# Patient Record
Sex: Male | Born: 1960 | Race: Black or African American | Hispanic: No | Marital: Married
Health system: Midwestern US, Community
[De-identification: ages and names within clinical notes are randomized; demographics above are authoritative.]

## PROBLEM LIST (undated history)

## (undated) DIAGNOSIS — I639 Cerebral infarction, unspecified: Secondary | ICD-10-CM

## (undated) DIAGNOSIS — E119 Type 2 diabetes mellitus without complications: Secondary | ICD-10-CM

## (undated) DIAGNOSIS — N289 Disorder of kidney and ureter, unspecified: Secondary | ICD-10-CM

## (undated) DIAGNOSIS — I10 Essential (primary) hypertension: Secondary | ICD-10-CM

---

## 2012-02-23 ENCOUNTER — Emergency Department (HOSPITAL_BASED_OUTPATIENT_CLINIC_OR_DEPARTMENT_OTHER): Payer: Worker's Compensation

## 2012-02-23 ENCOUNTER — Emergency Department (HOSPITAL_BASED_OUTPATIENT_CLINIC_OR_DEPARTMENT_OTHER)
Admission: EM | Admit: 2012-02-23 | Discharge: 2012-02-23 | Disposition: A | Discharge status: Home / Self Care | Payer: Worker's Compensation | Attending: Emergency Medicine | Admitting: Emergency Medicine

## 2012-02-23 ENCOUNTER — Encounter (HOSPITAL_BASED_OUTPATIENT_CLINIC_OR_DEPARTMENT_OTHER): Payer: Self-pay | Admitting: *Deleted

## 2012-02-23 DIAGNOSIS — S46909A Unspecified injury of unspecified muscle, fascia and tendon at shoulder and upper arm level, unspecified arm, initial encounter: Secondary | ICD-10-CM | POA: Insufficient documentation

## 2012-02-23 DIAGNOSIS — S43499A Other sprain of unspecified shoulder joint, initial encounter: Secondary | ICD-10-CM | POA: Insufficient documentation

## 2012-02-23 DIAGNOSIS — W010XXA Fall on same level from slipping, tripping and stumbling without subsequent striking against object, initial encounter: Secondary | ICD-10-CM | POA: Insufficient documentation

## 2012-02-23 DIAGNOSIS — M7989 Other specified soft tissue disorders: Secondary | ICD-10-CM | POA: Insufficient documentation

## 2012-02-23 DIAGNOSIS — Y929 Unspecified place or not applicable: Secondary | ICD-10-CM | POA: Insufficient documentation

## 2012-02-23 DIAGNOSIS — Y999 Unspecified external cause status: Secondary | ICD-10-CM | POA: Insufficient documentation

## 2012-02-23 DIAGNOSIS — S4990XA Unspecified injury of shoulder and upper arm, unspecified arm, initial encounter: Secondary | ICD-10-CM

## 2012-02-23 DIAGNOSIS — S4980XA Other specified injuries of shoulder and upper arm, unspecified arm, initial encounter: Secondary | ICD-10-CM | POA: Insufficient documentation

## 2012-02-23 DIAGNOSIS — Y939 Activity, unspecified: Secondary | ICD-10-CM | POA: Insufficient documentation

## 2012-02-23 MED ORDER — METOPROLOL TARTRATE 1 MG/ML IV SOLN: 5.0000 mg | Freq: Once | INTRAVENOUS | Status: DC
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
## 2014-09-24 ENCOUNTER — Emergency Department (HOSPITAL_BASED_OUTPATIENT_CLINIC_OR_DEPARTMENT_OTHER): Payer: Medicaid Other

## 2014-09-24 ENCOUNTER — Encounter (HOSPITAL_BASED_OUTPATIENT_CLINIC_OR_DEPARTMENT_OTHER): Payer: Self-pay | Admitting: *Deleted

## 2014-09-24 ENCOUNTER — Emergency Department (HOSPITAL_BASED_OUTPATIENT_CLINIC_OR_DEPARTMENT_OTHER)
Admission: EM | Admit: 2014-09-24 | Discharge: 2014-09-24 | Disposition: A | Discharge status: Home / Self Care | Payer: Medicaid Other | Attending: Emergency Medicine | Admitting: Emergency Medicine

## 2014-09-24 DIAGNOSIS — Y998 Other external cause status: Secondary | ICD-10-CM | POA: Insufficient documentation

## 2014-09-24 DIAGNOSIS — M25421 Effusion, right elbow: Secondary | ICD-10-CM | POA: Insufficient documentation

## 2014-09-24 DIAGNOSIS — S299XXA Unspecified injury of thorax, initial encounter: Secondary | ICD-10-CM | POA: Insufficient documentation

## 2014-09-24 DIAGNOSIS — Y9289 Other specified places as the place of occurrence of the external cause: Secondary | ICD-10-CM | POA: Insufficient documentation

## 2014-09-24 DIAGNOSIS — Y9389 Activity, other specified: Secondary | ICD-10-CM | POA: Diagnosis not present

## 2014-09-24 DIAGNOSIS — S59901A Unspecified injury of right elbow, initial encounter: Secondary | ICD-10-CM | POA: Diagnosis present

## 2014-09-24 DIAGNOSIS — Z720 Tobacco use: Secondary | ICD-10-CM | POA: Diagnosis not present

## 2014-09-24 DIAGNOSIS — M25521 Pain in right elbow: Secondary | ICD-10-CM

## 2014-09-24 DIAGNOSIS — S60511A Abrasion of right hand, initial encounter: Secondary | ICD-10-CM | POA: Diagnosis not present

## 2014-09-24 DIAGNOSIS — R0789 Other chest pain: Secondary | ICD-10-CM

## 2014-09-24 MED ORDER — OXYCODONE-ACETAMINOPHEN 5-325 MG PO TABS
2.0000 | ORAL_TABLET | Freq: Once | ORAL | Status: AC
  Administered 2014-09-24: 2 via ORAL
  Filled 2014-09-24: qty 2

## 2014-09-24 MED ORDER — IBUPROFEN 600 MG PO TABS: 600.0000 mg | ORAL_TABLET | Freq: Three times a day (TID) | ORAL | Status: DC | PRN

## 2014-09-24 MED ORDER — KETOROLAC TROMETHAMINE 60 MG/2ML IM SOLN
60.0000 mg | Freq: Once | INTRAMUSCULAR | Status: AC
  Administered 2014-09-24: 60 mg via INTRAMUSCULAR
  Filled 2014-09-24: qty 2

## 2014-09-24 MED ORDER — HYDROCODONE-ACETAMINOPHEN 5-325 MG PO TABS: 1.0000 | ORAL_TABLET | ORAL | Status: DC | PRN

## 2014-09-24 NOTE — ED Notes (Signed)
EMT at bedside to place splint

## 2014-09-24 NOTE — ED Notes (Signed)
Back to xray for 2nd elbow image

## 2014-09-24 NOTE — ED Notes (Signed)
Pt's wife was able to pick him up. D/c instructions reviewed with her as well per pt's request.
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
## 2014-09-24 NOTE — ED Notes (Signed)
Returned from xray

## 2014-09-24 NOTE — ED Notes (Signed)
Transported to xray 

## 2014-09-24 NOTE — ED Notes (Signed)
Pt now unable to get a ride home. Will stay here in the ED until 6am when he is able to drive
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
## 2014-09-24 NOTE — ED Notes (Signed)
MD with pt  
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
## 2014-09-24 NOTE — Discharge Instructions (Signed)
Elbow Fracture, Simple A fracture is a break in one of the bones.When fractures are not displaced or separated, they may be treated with only a sling or splint. The sling or splint may only be required for two to three weeks. In these cases, often the elbow is put through early range of motion exercises to prevent the elbow from getting stiff. DIAGNOSIS  The diagnosis (learning what is wrong) of a fractured elbow is made by x-ray. These may be required before and after the elbow is put into a splint or cast. X-rays are taken after to make sure the bone pieces have not moved. HOME CARE INSTRUCTIONS   Only take over-the-counter or prescription medicines for pain, discomfort, or fever as directed by your caregiver.  If you have a splint held on with an elastic wrap and your hand or fingers become numb or cold and blue, loosen the wrap and reapply more loosely. See your caregiver if there is no relief.  You may use ice for twenty minutes, four times per day, for the first two to three days.  Use your elbow as directed.  See your caregiver as directed. It is very important to keep all follow-up referrals and appointments in order to avoid any long-term problems with your elbow including chronic pain or stiffness. SEEK IMMEDIATE MEDICAL CARE IF:   There is swelling or increasing pain in elbow.  You begin to lose feeling or experience numbness or tingling in your hand or fingers.  You develop swelling of the hand and fingers.  You get a cold or blue hand or fingers on affected side. MAKE SURE YOU:   Understand these instructions.  Will watch your condition.  Will get help right away if you are not doing well or get worse. Document Released: 02/25/2001 Document Revised: 05/26/2011 Document Reviewed: 01/16/2009 Exit[employer] Patient Information 2015 [employer]. This information is not intended to replace advice given to you by your health care provider. Make sure you discuss any questions you  have with your health care provider.  

## 2016-07-19 ENCOUNTER — Inpatient Hospital Stay
Admit: 2016-07-19 | Discharge: 2016-07-20 | Discharge status: Against Medical Advice | Payer: Self-pay | Attending: Emergency Medicine

## 2016-07-19 ENCOUNTER — Emergency Department: Admit: 2016-07-19 | Payer: Self-pay | Primary: Family Medicine

## 2016-07-19 DIAGNOSIS — I161 Hypertensive emergency: Secondary | ICD-10-CM

## 2016-07-19 LAB — METABOLIC PANEL, BASIC
BUN: 22 mg/dl (ref 7–25)
CO2: 27 mEq/L (ref 21–32)
Calcium: 8.6 mg/dl (ref 8.5–10.1)
Chloride: 103 mEq/L (ref 98–107)
Creatinine: 2.5 mg/dl — ABNORMAL HIGH (ref 0.6–1.3)
GFR est AA: 34
GFR est non-AA: 28
Glucose: 243 mg/dl — ABNORMAL HIGH (ref 74–106)
Potassium: 3.7 mEq/L (ref 3.5–5.1)
Sodium: 138 mEq/L (ref 136–145)

## 2016-07-19 LAB — CBC WITH AUTOMATED DIFF
BASOPHILS: 0.3 % (ref 0–3)
EOSINOPHILS: 3.4 % (ref 0–5)
HCT: 35.1 % — ABNORMAL LOW (ref 37.0–50.0)
HGB: 11.2 gm/dl — ABNORMAL LOW (ref 12.4–17.2)
IMMATURE GRANULOCYTES: 0.3 % (ref 0.0–3.0)
LYMPHOCYTES: 30.5 % (ref 28–48)
MCH: 23.7 pg (ref 23.0–34.6)
MCHC: 31.9 gm/dl (ref 30.0–36.0)
MCV: 74.2 fL — ABNORMAL LOW (ref 80.0–98.0)
MONOCYTES: 5 % (ref 1–13)
MPV: 9.5 fL (ref 6.0–10.0)
NEUTROPHILS: 60.5 % (ref 34–64)
NRBC: 0 (ref 0–0)
PLATELET: 233 10*3/uL (ref 140–450)
RBC: 4.73 M/uL (ref 3.80–5.70)
RDW-SD: 38.2 (ref 35.1–43.9)
WBC: 7.8 10*3/uL (ref 4.0–11.0)

## 2016-07-19 LAB — MAGNESIUM: Magnesium: 1.8 mg/dl (ref 1.6–2.6)

## 2016-07-19 LAB — POC TROPONIN: Troponin-I: 0.05 ng/ml (ref 0.00–0.07)

## 2016-07-19 MED ORDER — SODIUM CHLORIDE 0.9 % IJ SYRG
Freq: Once | INTRAMUSCULAR | Status: DC
Start: 2016-07-19 — End: 2016-07-20
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
## 2016-07-19 NOTE — ED Provider Notes (Addendum)
[facility]  Emergency Department Treatment Report        Patient: Kevin MolderDwayne Calhoun Age: 56 y.o. Sex: male    Date of Birth: 11/03/1960 Admit Date: 07/19/2016 PCP: Sol BlazingPhys Other, MD   MRN: [id number]  CSN: [id number]     Room: OTF/OTF Time Dictated: 6:29 PM      Chief Complaint   Chief Complaint   Patient presents with   ??? Chest Pain       History of Present Illness   56 y.o. male with history of hypertension, hyperlipidemia, insulin-dependent diabetes, smoking presenting to the emergency department with the complaint of 2-3 minutes of substernal chest tightness.  The pain did not radiate.  The patient started expressing pain after going outside smoking a cigarette.  The patient did not want to come to the emergency department, but his wife and other family members urged him to call an ambulance and come here for further evaluation.  Recent is pain-free at this time.  He states he is pain-free at the time of EMS evaluation.  He was given one nitroglycerin and aspirin on the scene.  His blood pressure on the scene was 210 systolic.  He has no history of myocardial infarction.  He denies any family history of myocardial infarction.  He denies any history of DVT or PE.  He denies any leg swelling.  He did have a stroke in July of this year and has residual right-sided weakness that is unchanged.  Denies any fever, any chills, any productive cough, any shortness of breath.  He says that he never has pain or shortness of breath or issues with exertion    Review of Systems   Review of Systems   Constitutional: Negative for chills and fever.   HENT: Negative for congestion.    Eyes: Negative for blurred vision.   Respiratory: Negative for cough, sputum production and shortness of breath.    Cardiovascular: Positive for chest pain. Negative for palpitations and leg swelling.   Gastrointestinal: Negative for abdominal pain, diarrhea, nausea and vomiting.    Genitourinary: Negative for dysuria, frequency and urgency.   Musculoskeletal: Negative for myalgias.   Skin: Negative for rash.   Neurological: Positive for focal weakness (History of stroke with right-sided weakness). Negative for speech change and headaches.       Past Medical/Surgical History     Past Medical History:   Diagnosis Date   ??? CVA (cerebral vascular accident) (HCC)     right side deficit     History reviewed. No pertinent surgical history.    Social History     Social History     Social History   ??? Marital status: MARRIED     Spouse name: N/A   ??? Number of children: N/A   ??? Years of education: N/A     Occupational History   ??? Not on file.     Social History Main Topics   ??? Smoking status: Current Some Day Smoker   ??? Smokeless tobacco: Never Used   ??? Alcohol use No   ??? Drug use: No   ??? Sexual activity: Not on file     Other Topics Concern   ??? Not on file     Social History Narrative   ??? No narrative on file       Family History   History reviewed. No pertinent family history.    Current Medications     None       Allergies  No Known Allergies    Physical Exam   ED Triage Vitals   ED Encounter Vitals Group      BP 07/19/16 1827 157/91      Pulse (Heart Rate) 07/19/16 1827 85      Resp Rate 07/19/16 1827 16      Temp 07/19/16 1827 97.5 ??F (36.4 ??C)      Temp src --       O2 Sat (%) 07/19/16 1827 98 %      Weight 07/19/16 1827 200 lb      Height 07/19/16 1827 5\' 11"      Physical Exam   Constitutional: He is oriented to person, place, and time and well-developed, well-nourished, and in no distress. No distress.   HENT:   Head: Normocephalic and atraumatic.   Eyes: No scleral icterus.   Neck: No JVD present.   Cardiovascular: Normal rate, regular rhythm, normal heart sounds and intact distal pulses.  Exam reveals no gallop and no friction rub.    No murmur heard.  2+ equal radial pulses bilaterally   Pulmonary/Chest: Effort normal and breath sounds normal. No respiratory distress. He has no wheezes.    Abdominal: Soft. Bowel sounds are normal. He exhibits no distension. There is no tenderness.   Musculoskeletal: He exhibits no edema.   Neurological: He is alert and oriented to person, place, and time. GCS score is 15.   Skin: Skin is warm and dry. He is not diaphoretic.   Nursing note and vitals reviewed.       Impression and Management Plan/MDM/ED Course   56 year old male presenting to the emergency department with chest tightness that is now resolved.  He has history of hypertension hyperlipidemia, diabetes, smoking.  He has not had a myocardial infarction before.  His prehospital EKG was sinus rhythm at a rate of 81.  His symptoms nonspecific ST changes, specifically there is a somewhat planar ST segments in V2 but in the setting of an RSR prime J point has a high takeoff but there is 1 mm of elevation there.  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  Otherwise EKG is consistent with a prehospital EKG.  I do not think this is a pulmonary embolism, patient has no unilateral leg swelling, has no pleuritic chest pain is chest pain-free right now.    I reviewed his lab values, he has a creatinine that is elevated at 2.48.  I don't know what his baseline is.  His troponin is detectable at 0.05.  He has uncontrolled type 2 diabetes with a glucose of 243.  His bicarbonate is normal.  He does not have an anion gap.  I do not think he is diabetic ketoacidosis  His chest x-ray was negative for any infiltrate or effusion.  I don't think that this is a presentation of pneumonia.  He has no history of DVT or pulmonary embolism.  I don't think this is a pulmonary embolism.  He has no lower leg swelling.  He has significant cardiac risk factors and slight EKG changes consistent with underlying cardiovascular disease.  I would like to admit him to the hospital for  further cardiology evaluation.  Especially, with his severe range hypertension and elevated creatinine as well as detectable troponin, which would be consistent with hypertensive emergency.  The patient however, emphatically does not want to be admitted to the hospital.  He  is competent to make his own medical decisions and is alert and oriented ??3.  His wife is at the bedside as well and agrees with his decision making.  Patient was told that he would be risking permanent disability or death should he leave the hospital AGAINST MEDICAL ADVICE.  We offered transfer to another facility, since there concern was that they would have to go back to [place] where they live. They declined to do this.  They did say that they would follow-up with the physician at their home in [place].  I gave strict return precautions and offered them the opportunity to return to the emergency department any time should they change her mind regarding leaving AGAINST MEDICAL ADVICE          Diagnostic Studies   Lab:   Recent Results (from the past 12 hour(s))   CBC WITH AUTOMATED DIFF    Collection Time: 07/19/16  6:48 PM   Result Value Ref Range    WBC 7.8 4.0 - 11.0 1000/mm3    RBC 4.73 3.80 - 5.70 M/uL    HGB 11.2 (L) 12.4 - 17.2 gm/dl    HCT 78.4 (L) 69.6 - 50.0 %    MCV 74.2 (L) 80.0 - 98.0 fL    MCH 23.7 23.0 - 34.6 pg    MCHC 31.9 30.0 - 36.0 gm/dl    PLATELET 295 284 - 132 1000/mm3    MPV 9.5 6.0 - 10.0 fL    RDW-SD 38.2 35.1 - 43.9      NRBC 0 0 - 0      IMMATURE GRANULOCYTES 0.3 0.0 - 3.0 %    NEUTROPHILS 60.5 34 - 64 %    LYMPHOCYTES 30.5 28 - 48 %    MONOCYTES 5.0 1 - 13 %    EOSINOPHILS 3.4 0 - 5 %    BASOPHILS 0.3 0 - 3 %   METABOLIC PANEL, BASIC    Collection Time: 07/19/16  6:48 PM   Result Value Ref Range    Sodium 138 136 - 145 mEq/L    Potassium 3.7 3.5 - 5.1 mEq/L    Chloride 103 98 - 107 mEq/L    CO2 27 21 - 32 mEq/L    Glucose 243 (H) 74 - 106 mg/dl    BUN 22 7 - 25 mg/dl     Creatinine 2.5 (H) 0.6 - 1.3 mg/dl    GFR est AA 44.0      GFR est non-AA 28      Calcium 8.6 8.5 - 10.1 mg/dl   MAGNESIUM    Collection Time: 07/19/16  6:48 PM   Result Value Ref Range    Magnesium 1.8 1.6 - 2.6 mg/dl   POC TROPONIN-I    Collection Time: 07/19/16  6:53 PM   Result Value Ref Range    Troponin-I 0.05 0.00 - 0.07 ng/ml     Labs Reviewed   CBC WITH AUTOMATED DIFF - Abnormal; Notable for the following:        Result Value    HGB 11.2 (*)     HCT 35.1 (*)     MCV 74.2 (*)     All other components within normal limits   METABOLIC PANEL, BASIC - Abnormal; Notable for the following:     Glucose 243 (*)     Creatinine 2.5 (*)     All other components within normal limits   MAGNESIUM   POC TROPONIN-I  Imaging:    Xr Chest Pa Lat    Result Date: 07/19/2016  INDICATION: chest pain   EXAMINATION: XR CHEST PA LAT COMPARISON: None FINDINGS: The study shows a normal sized heart.. The lungs are clear and well expanded.       IMPRESSION: Normal chest       Final Diagnosis       ICD-10-CM ICD-9-CM   1. Hypertensive emergency I16.1 401.9   2. Acute kidney injury (HCC) N17.9 584.9   3. Chest pain, unspecified type R07.9 786.50   4. Type 2 diabetes mellitus with hyperglycemia, with long-term current use of insulin (HCC) E11.65 250.00    Z79.4 790.29     V58.67       Disposition   Discharge from the hospital against medical advice      The patient was personally evaluated by myself and Dr. Thana Ates who agrees with the above assessment and plan.    Knox Royalty, MD  [facility]  Jul 20, 2016    My signature above authenticates this document and my orders, the final ??  diagnosis (es), discharge prescription (s), and instructions in the Epic ??  record.  If you have any questions please contact [phone].  ??  Nursing notes have been reviewed by the physician/ advanced practice ??  Clinician.    Dragon medical dictation software was used for portions of this report. Unintended voice recognition errors may occur.     Continued by Dr. Dyke Brackett dictation software was used for portions of this report. Unintended errors in transcription may occur.    I interviewed and examined the patient. Discussed with the resident and agree with their evaluation and plan as documented here.    Patient is evaluated for the complaint of chest pain.  Had a brief episode of chest pain that is currently resolved.  He is originally from [place] and he is here with his family.  On exam he denies any pain, his heart is regular his lungs are clear his abdomen is nontender.  He has history of stroke but doesn't really seem to have any drift in the right arm or right leg, left arm left leg normal strength.  Both arms warm and well-perfused with good radial pulses.  ??  PE dissection considered but thought to be less likely pain is resolved  He is hypertensive.  He was evaluated with chest x-ray EKG and labs.  Recommendation made to patient and family for admission and further evaluation.  Even from the time right after he got here the patient was adamant that he wanted to be discharged.  He is a and O ??3.  He understands our concerns and he states he does not want to stay and would like to be discharged to go home.  His wife indicates that she would also like him to be treated as possible closer to their home in [place].  She states she intends to drive him home and she thinks he'll he'll be okay.  She indicated 1 difficulty with staying here is that she has to go home to [place] and could not drive back and forth to see the patient.  I offered to initiate a hospital to hospital transfer by ambulance if they're concerned with the location and both patient and wife declined concerned about the cost.  Patient is aware that he risk permanent disability or death by leaving AGAINST MEDICAL ADVICE, his wife understands that as well,  he is advised to seek follow-up treatment as  soon as possible and certainly return to the ER if symptoms recur or he has new or worse symptoms.

## 2016-07-19 NOTE — ED Notes (Signed)
9:51 PM  07/19/16     Discharge instructions given to pt (name) with verbalization of understanding. Patient accompanied by family.  Patient discharged with the following prescriptions none. Patient discharged to home (destination).      Kevin GabElise Marie Laurence Joulaud, RN

## 2016-07-20 LAB — EKG 12-LEAD
Atrial Rate: 84 {beats}/min
Diagnosis: NORMAL
P Axis: 44 degrees
P-R Interval: 164 ms
Q-T Interval: 402 ms
QRS Duration: 142 ms
QTc Calculation (Bazett): 475 ms
R Axis: -59 degrees
T Axis: 37 degrees
Ventricular Rate: 84 {beats}/min

## 2016-07-20 LAB — EKG, 12 LEAD, INITIAL
Atrial Rate: 84 {beats}/min
Calculated P Axis: 44 degrees
Calculated R Axis: -59 degrees
Calculated T Axis: 37 degrees
Diagnosis: NORMAL
P-R Interval: 164 ms
Q-T Interval: 402 ms
QRS Duration: 142 ms
QTC Calculation (Bezet): 475 ms
Ventricular Rate: 84 {beats}/min

## 2016-07-21 ENCOUNTER — Observation Stay (HOSPITAL_BASED_OUTPATIENT_CLINIC_OR_DEPARTMENT_OTHER): Payer: Self-pay

## 2016-07-21 ENCOUNTER — Encounter (HOSPITAL_BASED_OUTPATIENT_CLINIC_OR_DEPARTMENT_OTHER): Payer: Self-pay | Admitting: Emergency Medicine

## 2016-07-21 ENCOUNTER — Observation Stay (HOSPITAL_BASED_OUTPATIENT_CLINIC_OR_DEPARTMENT_OTHER)
Admission: EM | Admit: 2016-07-21 | Discharge: 2016-07-23 | Disposition: A | Discharge status: Home / Self Care | Payer: Self-pay | Attending: Internal Medicine | Admitting: Internal Medicine

## 2016-07-21 DIAGNOSIS — F17210 Nicotine dependence, cigarettes, uncomplicated: Secondary | ICD-10-CM | POA: Insufficient documentation

## 2016-07-21 DIAGNOSIS — R269 Unspecified abnormalities of gait and mobility: Secondary | ICD-10-CM

## 2016-07-21 DIAGNOSIS — R7989 Other specified abnormal findings of blood chemistry: Secondary | ICD-10-CM | POA: Diagnosis present

## 2016-07-21 DIAGNOSIS — I69320 Aphasia following cerebral infarction: Secondary | ICD-10-CM | POA: Insufficient documentation

## 2016-07-21 DIAGNOSIS — E781 Pure hyperglyceridemia: Secondary | ICD-10-CM | POA: Insufficient documentation

## 2016-07-21 DIAGNOSIS — I69331 Monoplegia of upper limb following cerebral infarction affecting right dominant side: Secondary | ICD-10-CM | POA: Insufficient documentation

## 2016-07-21 DIAGNOSIS — I160 Hypertensive urgency: Secondary | ICD-10-CM | POA: Diagnosis present

## 2016-07-21 DIAGNOSIS — E1122 Type 2 diabetes mellitus with diabetic chronic kidney disease: Secondary | ICD-10-CM | POA: Insufficient documentation

## 2016-07-21 DIAGNOSIS — E1165 Type 2 diabetes mellitus with hyperglycemia: Secondary | ICD-10-CM | POA: Diagnosis present

## 2016-07-21 DIAGNOSIS — Z8673 Personal history of transient ischemic attack (TIA), and cerebral infarction without residual deficits: Secondary | ICD-10-CM

## 2016-07-21 DIAGNOSIS — G44209 Tension-type headache, unspecified, not intractable: Secondary | ICD-10-CM

## 2016-07-21 DIAGNOSIS — I129 Hypertensive chronic kidney disease with stage 1 through stage 4 chronic kidney disease, or unspecified chronic kidney disease: Secondary | ICD-10-CM | POA: Insufficient documentation

## 2016-07-21 DIAGNOSIS — E118 Type 2 diabetes mellitus with unspecified complications: Secondary | ICD-10-CM

## 2016-07-21 DIAGNOSIS — Z79899 Other long term (current) drug therapy: Secondary | ICD-10-CM | POA: Insufficient documentation

## 2016-07-21 DIAGNOSIS — I4510 Unspecified right bundle-branch block: Secondary | ICD-10-CM | POA: Insufficient documentation

## 2016-07-21 DIAGNOSIS — E785 Hyperlipidemia, unspecified: Secondary | ICD-10-CM | POA: Insufficient documentation

## 2016-07-21 DIAGNOSIS — Z794 Long term (current) use of insulin: Secondary | ICD-10-CM | POA: Insufficient documentation

## 2016-07-21 DIAGNOSIS — IMO0002 Reserved for concepts with insufficient information to code with codable children: Secondary | ICD-10-CM

## 2016-07-21 DIAGNOSIS — N179 Acute kidney failure, unspecified: Secondary | ICD-10-CM | POA: Diagnosis present

## 2016-07-21 DIAGNOSIS — R079 Chest pain, unspecified: Principal | ICD-10-CM

## 2016-07-21 DIAGNOSIS — D509 Iron deficiency anemia, unspecified: Secondary | ICD-10-CM | POA: Insufficient documentation

## 2016-07-21 DIAGNOSIS — I452 Bifascicular block: Secondary | ICD-10-CM | POA: Insufficient documentation

## 2016-07-21 DIAGNOSIS — R072 Precordial pain: Secondary | ICD-10-CM

## 2016-07-21 DIAGNOSIS — Z7982 Long term (current) use of aspirin: Secondary | ICD-10-CM | POA: Insufficient documentation

## 2016-07-21 DIAGNOSIS — N183 Chronic kidney disease, stage 3 (moderate): Secondary | ICD-10-CM | POA: Insufficient documentation

## 2016-07-21 DIAGNOSIS — R778 Other specified abnormalities of plasma proteins: Secondary | ICD-10-CM | POA: Diagnosis present

## 2016-07-21 HISTORY — DX: Type 2 diabetes mellitus without complications: E11.9

## 2016-07-21 HISTORY — DX: Essential (primary) hypertension: I10

## 2016-07-21 HISTORY — DX: Disorder of kidney and ureter, unspecified: N28.9

## 2016-07-21 HISTORY — DX: Cerebral infarction, unspecified: I63.9

## 2016-07-21 LAB — URINALYSIS, ROUTINE W REFLEX MICROSCOPIC
BILIRUBIN URINE: NEGATIVE
Glucose, UA: 100 mg/dL — AB
KETONES UR: NEGATIVE mg/dL
Leukocytes, UA: NEGATIVE
NITRITE: NEGATIVE
PH: 6.5 (ref 5.0–8.0)
Protein, ur: >300 mg/dL — AB
Specific Gravity, Urine: 1.014 (ref 1.005–1.030)

## 2016-07-21 LAB — BASIC METABOLIC PANEL
Anion gap: 6 (ref 5–15)
BUN: 21 mg/dL — AB (ref 6–20)
CALCIUM: 8.8 mg/dL — AB (ref 8.9–10.3)
CO2: 27 mmol/L (ref 22–32)
Chloride: 105 mmol/L (ref 101–111)
Creatinine, Ser: 2.18 mg/dL — ABNORMAL HIGH (ref 0.61–1.24)
GFR calc Af Amer: 37 mL/min — ABNORMAL LOW (ref >60)
GFR, EST NON AFRICAN AMERICAN: 32 mL/min — AB (ref >60)
GLUCOSE: 162 mg/dL — AB (ref 65–99)
Potassium: 3.8 mmol/L (ref 3.5–5.1)
Sodium: 138 mmol/L (ref 135–145)

## 2016-07-21 LAB — CBC WITH DIFFERENTIAL/PLATELET
BASOS ABS: 0 10*3/uL (ref 0.0–0.1)
BASOS PCT: 0 %
EOS ABS: 0.3 10*3/uL (ref 0.0–0.7)
EOS PCT: 4 %
HCT: 35 % — ABNORMAL LOW (ref 39.0–52.0)
Hemoglobin: 11.7 g/dL — ABNORMAL LOW (ref 13.0–17.0)
Lymphocytes Relative: 34 %
Lymphs Abs: 2.4 10*3/uL (ref 0.7–4.0)
MCH: 24.3 pg — ABNORMAL LOW (ref 26.0–34.0)
MCHC: 33.4 g/dL (ref 30.0–36.0)
MCV: 72.8 fL — AB (ref 78.0–100.0)
MONO ABS: 0.6 10*3/uL (ref 0.1–1.0)
Monocytes Relative: 9 %
Neutro Abs: 3.9 10*3/uL (ref 1.7–7.7)
Neutrophils Relative %: 54 %
PLATELETS: 240 10*3/uL (ref 150–400)
RBC: 4.81 MIL/uL (ref 4.22–5.81)
RDW: 14.6 % (ref 11.5–15.5)
WBC: 7.2 10*3/uL (ref 4.0–10.5)

## 2016-07-21 LAB — URINALYSIS, MICROSCOPIC (REFLEX)

## 2016-07-21 MED ORDER — METOPROLOL TARTRATE 5 MG/5ML IV SOLN
10.0000 mg | Freq: Once | INTRAVENOUS | Status: AC
  Administered 2016-07-21: 10 mg via INTRAVENOUS
  Filled 2016-07-21: qty 10

## 2016-07-21 MED ORDER — LISINOPRIL 10 MG PO TABS
10.0000 mg | ORAL_TABLET | Freq: Every day | ORAL | Status: DC
  Administered 2016-07-21: 10 mg via ORAL
  Filled 2016-07-21: qty 1

## 2016-07-21 MED ORDER — ASPIRIN 81 MG PO CHEW
324.0000 mg | CHEWABLE_TABLET | Freq: Once | ORAL | Status: AC
  Administered 2016-07-21: 324 mg via ORAL
  Filled 2016-07-21: qty 4

## 2016-07-21 MED ORDER — HYDRALAZINE HCL 20 MG/ML IJ SOLN
10.0000 mg | Freq: Once | INTRAMUSCULAR | Status: AC
  Administered 2016-07-21: 10 mg via INTRAVENOUS
  Filled 2016-07-21: qty 1

## 2016-07-21 NOTE — ED Notes (Signed)
Paged hospitalist and cardiologist via carelink @ 9:39pm

## 2016-07-21 NOTE — ED Triage Notes (Addendum)
Patient [place] that [place] were out of town. Patient started to have chest pain. Patient also reports a Headache  - patient has a complete work up and it was suggested that he be admitted because of elevated kidney function and increased troponin of 0.05, he signed out AMA. Patient is currently denying Chest pain

## 2016-07-21 NOTE — ED Provider Notes (Signed)
Emergency Department Provider Note   I have reviewed the triage vital signs and the nursing notes.  By signing my name below, I, Modena Jansky, attest that this documentation has been prepared under the direction and in the presence of Dawanna Grauberger, Arlyss Repress, MD. Electronically Signed: Modena Jansky, Scribe. 07/21/2016. 8:07 PM.  HISTORY  Chief Complaint Headache  HPI Comments: Philip Martin is a 56 y.o. male who presents to the Emergency Department complaining of intermittent moderate chest pain that started 2 days ago. He reports having chest pain while he was traveling. He was evaluated at [facility] but left before he received treatment. He came to the ED as a follow-up to prior visit. He describes the pain as tight sensation lasting for about 1-2 minutes. He reports associated heart palpitations ("fluttering") and headache (gradually worsening). He admits to a hx of DM, HTN, renal disorder, and stroke. He denies any other complaints at this time.    Past Medical History:  Diagnosis Date  . Diabetes mellitus without complication (HCC)   . Hypertension   . Renal disorder   . Stroke [facility])     Patient Active Problem List   Diagnosis Date Noted  . Chest pain 07/21/2016    History reviewed. No pertinent surgical history.    Allergies Patient has no known allergies.  History reviewed. No pertinent family history.  Social History Social History  Substance Use Topics  . Smoking status: Current Every Day Smoker  . Smokeless tobacco: Never Used  . Alcohol use No    Review of Systems Constitutional: No fever/chills Eyes: No visual changes. ENT: No sore throat. Cardiovascular: +chest pain, +heart palpitations Respiratory: Denies shortness of breath. Gastrointestinal: No abdominal pain.  No nausea, no vomiting.  No diarrhea.  No constipation. Genitourinary: Negative for dysuria. Musculoskeletal: Negative for back pain. Skin: Negative for rash. Neurological:  +headaches, denies focal weakness or numbness.  10-point ROS otherwise negative.  ____________________________________________   PHYSICAL EXAM:  VITAL SIGNS: ED Triage Vitals  Enc Vitals Group     BP 07/21/16 1930 (!) 185/118     Pulse Rate 07/21/16 1930 81     Resp 07/21/16 1930 20     Temp 07/21/16 1930 98.2 F (36.8 C)     Temp Source 07/21/16 1930 Oral     SpO2 07/21/16 1930 100 %     Weight 07/21/16 1930 200 lb (90.7 kg)     Height 07/21/16 1930 5\' 11"  (1.803 m)     Pain Score 07/21/16 1927 7   Constitutional: Alert and oriented. Well appearing and in no acute distress. Eyes: Conjunctivae are normal. Head: Atraumatic. Nose: No congestion/rhinnorhea. Mouth/Throat: Mucous membranes are moist.  Oropharynx non-erythematous. Neck: No stridor.   Cardiovascular: Normal rate, regular rhythm. Good peripheral circulation. Grossly normal heart sounds.   Respiratory: Normal respiratory effort.  No retractions. Lungs CTAB. Gastrointestinal: Soft and nontender. No distention.  Musculoskeletal: No lower extremity tenderness nor edema. No gross deformities of extremities. Neurologic:  Normal language. Positive slurred speech. RUE weakness 3/5 (baseline) with associated sensory deficit and right face droop.  Skin:  Skin is warm, dry and intact. No rash noted. Psychiatric: Mood and affect are normal. Speech and behavior are normal.  ____________________________________________   LABS (all labs ordered are listed, but only abnormal results are displayed)  Labs Reviewed  BASIC METABOLIC PANEL - Abnormal; Notable for the following:       Result Value   Glucose, Bld 162 (*)    BUN 21 (*)  Creatinine, Ser 2.18 (*)    Calcium 8.8 (*)    GFR calc non Af Amer 32 (*)    GFR calc Af Amer 37 (*)    All other components within normal limits  CBC WITH DIFFERENTIAL/PLATELET - Abnormal; Notable for the following:    Hemoglobin 11.7 (*)    HCT 35.0 (*)    MCV 72.8 (*)    MCH 24.3 (*)     All other components within normal limits  TROPONIN I - Abnormal; Notable for the following:    Troponin I 0.05 (*)    All other components within normal limits  URINALYSIS, ROUTINE W REFLEX MICROSCOPIC - Abnormal; Notable for the following:    Glucose, UA 100 (*)    Hgb urine dipstick TRACE (*)    Protein, ur >300 (*)    All other components within normal limits  URINALYSIS, MICROSCOPIC (REFLEX) - Abnormal; Notable for the following:    Bacteria, UA RARE (*)    Squamous Epithelial / LPF 0-5 (*)    All other components within normal limits  TROPONIN I - Abnormal; Notable for the following:    Troponin I 0.05 (*)    All other components within normal limits  TROPONIN I - Abnormal; Notable for the following:    Troponin I 0.04 (*)    All other components within normal limits  TROPONIN I - Abnormal; Notable for the following:    Troponin I 0.04 (*)    All other components within normal limits  LIPID PANEL - Abnormal; Notable for the following:    Cholesterol 279 (*)    Triglycerides 537 (*)    HDL 31 (*)    All other components within normal limits  IRON AND TIBC - Abnormal; Notable for the following:    Iron 44 (*)    TIBC 248 (*)    All other components within normal limits  CBC WITH DIFFERENTIAL/PLATELET - Abnormal; Notable for the following:    Hemoglobin 11.3 (*)    HCT 35.8 (*)    MCV 72.5 (*)    MCH 22.9 (*)    All other components within normal limits  BASIC METABOLIC PANEL - Abnormal; Notable for the following:    Glucose, Bld 170 (*)    BUN 21 (*)    Creatinine, Ser 1.86 (*)    Calcium 8.7 (*)    GFR calc non Af Amer 39 (*)    GFR calc Af Amer 45 (*)    All other components within normal limits  GLUCOSE, CAPILLARY - Abnormal; Notable for the following:    Glucose-Capillary 183 (*)    All other components within normal limits  MRSA PCR SCREENING  RAPID URINE DRUG SCREEN, HOSP PERFORMED  TSH  HIV ANTIBODY (ROUTINE TESTING)  HEMOGLOBIN A1C    ____________________________________________  EKG   EKG Interpretation  Date/Time:  Monday Jul 21 2016 19:40:09 EDT Ventricular Rate:  79 PR Interval:    QRS Duration: 139 QT Interval:  398 QTC Calculation: 457 R Axis:   -53 Text Interpretation:  Sinus rhythm RBBB and LAFB No STEMI. No old tracing for comparison.  Confirmed by Kymberlyn Eckford MD, Yaziel Brandon [phone] on 07/21/2016 7:44:02 PM Also confirmed by Tangee Marszalek MD, Adaja Wander [phone]), editor Misty StanleyScales-Price, Shannon [phone])  on 07/22/2016 7:30:05 AM       ____________________________________________  RADIOLOGY  Dg Chest 2 View  Result Date: 07/21/2016 CLINICAL DATA:  56 y/o  M; palpitations with headache. EXAM: CHEST  2 VIEW COMPARISON:  09/24/2014  chest radiograph. FINDINGS: Stable heart size and mediastinal contours are within normal limits. Both lungs are clear. The visualized skeletal structures are unremarkable. IMPRESSION: No active cardiopulmonary disease. Electronically Signed   By: Mitzi Hansen M.D.   On: 07/21/2016 23:39    ____________________________________________   PROCEDURES  Procedure(s) performed:   Procedures  None ____________________________________________   INITIAL IMPRESSION / ASSESSMENT AND PLAN / ED COURSE  Pertinent labs & imaging results that were available during my care of the patient were reviewed by me and considered in my medical decision making (see chart for details).  Patient presents to the ED for evaluation of intermittent chest pressure for the last 3 days. Worst pain was 2 days prior but has had intermittent symptoms since. No acute distress or active pain. BP significantly elevated but patient not taking any home medications. EKG with no acute ischemia but no old for comparison.   10:00 PM Spoke with cardiology regarding the patient. Given his chronic kidney disease and significantly elevated blood pressure have lower suspicion for ACS with elevated troponin.   Discussed patient's case  with hospitalist, Dr. Toniann Fail. Patient and family (if present) updated with plan. Care transferred to hospitalist service.  I reviewed all nursing notes, vitals, pertinent old records, EKGs, labs, imaging (as available).  After discussion with the hospitalist will give additional PRN BP medication. Patient not responding well to Metoprolol. Will try hydralazine but transition to Nitro drip if that doesn't work well. Dr. Toniann Fail will place temporary orders [facility] stepdown bed at [facility].   11:06 PM BP down-trending with PRN medication. No nitro drip at this time. Bed assigned. Awaiting transport.  ____________________________________________  FINAL CLINICAL IMPRESSION(S) / ED DIAGNOSES  Final diagnoses:  Precordial chest pain  Tension headache     MEDICATIONS GIVEN DURING THIS VISIT:  Medications  hydrALAZINE (APRESOLINE) injection 10 mg (10 mg Intravenous Given by Other 07/22/16 0133)  aspirin EC tablet 325 mg (325 mg Oral Given 07/22/16 0910)  acetaminophen (TYLENOL) tablet 650 mg (not administered)  ondansetron (ZOFRAN) injection 4 mg (not administered)  enoxaparin (LOVENOX) injection 40 mg (40 mg Subcutaneous Given 07/22/16 0910)  morphine 4 MG/ML injection 2 mg (not administered)  gi cocktail (Maalox,Lidocaine,Donnatal) (not administered)  insulin aspart (novoLOG) injection 0-9 Units (2 Units Subcutaneous Given 07/22/16 1004)  insulin aspart (novoLOG) injection 0-5 Units (not administered)  amLODipine (NORVASC) tablet 5 mg (5 mg Oral Given 07/22/16 0910)  carvedilol (COREG) tablet 6.25 mg (6.25 mg Oral Given 07/22/16 0910)  atorvastatin (LIPITOR) tablet 40 mg (not administered)  aspirin chewable tablet 324 mg (324 mg Oral Given 07/21/16 2134)  metoprolol (LOPRESSOR) injection 10 mg (10 mg Intravenous Given 07/21/16 2136)  hydrALAZINE (APRESOLINE) injection 10 mg (10 mg Intravenous Given 07/21/16 2211)     NEW OUTPATIENT MEDICATIONS STARTED DURING THIS VISIT:  None   Note:  This  document was prepared using Dragon voice recognition software and may include unintentional dictation errors.  I personally performed the services described in this documentation, which was scribed in my presence. The recorded information has been reviewed and is accurate.   Alona Bene, MD Emergency Medicine    Rebacca Votaw, Arlyss Repress, MD 07/22/16 1049

## 2016-07-22 ENCOUNTER — Other Ambulatory Visit (HOSPITAL_COMMUNITY): Payer: Self-pay

## 2016-07-22 DIAGNOSIS — E118 Type 2 diabetes mellitus with unspecified complications: Secondary | ICD-10-CM

## 2016-07-22 DIAGNOSIS — I160 Hypertensive urgency: Secondary | ICD-10-CM | POA: Diagnosis present

## 2016-07-22 DIAGNOSIS — Z794 Long term (current) use of insulin: Secondary | ICD-10-CM

## 2016-07-22 DIAGNOSIS — R7989 Other specified abnormal findings of blood chemistry: Secondary | ICD-10-CM | POA: Diagnosis present

## 2016-07-22 DIAGNOSIS — N179 Acute kidney failure, unspecified: Secondary | ICD-10-CM

## 2016-07-22 DIAGNOSIS — Z8673 Personal history of transient ischemic attack (TIA), and cerebral infarction without residual deficits: Secondary | ICD-10-CM

## 2016-07-22 DIAGNOSIS — I161 Hypertensive emergency: Secondary | ICD-10-CM

## 2016-07-22 DIAGNOSIS — I10 Essential (primary) hypertension: Secondary | ICD-10-CM

## 2016-07-22 DIAGNOSIS — N183 Chronic kidney disease, stage 3 (moderate): Secondary | ICD-10-CM

## 2016-07-22 DIAGNOSIS — R269 Unspecified abnormalities of gait and mobility: Secondary | ICD-10-CM

## 2016-07-22 DIAGNOSIS — IMO0002 Reserved for concepts with insufficient information to code with codable children: Secondary | ICD-10-CM | POA: Diagnosis present

## 2016-07-22 DIAGNOSIS — I248 Other forms of acute ischemic heart disease: Secondary | ICD-10-CM

## 2016-07-22 DIAGNOSIS — R748 Abnormal levels of other serum enzymes: Secondary | ICD-10-CM

## 2016-07-22 DIAGNOSIS — R0789 Other chest pain: Secondary | ICD-10-CM

## 2016-07-22 DIAGNOSIS — E781 Pure hyperglyceridemia: Secondary | ICD-10-CM

## 2016-07-22 DIAGNOSIS — R778 Other specified abnormalities of plasma proteins: Secondary | ICD-10-CM | POA: Diagnosis present

## 2016-07-22 DIAGNOSIS — E1165 Type 2 diabetes mellitus with hyperglycemia: Secondary | ICD-10-CM

## 2016-07-22 DIAGNOSIS — E1121 Type 2 diabetes mellitus with diabetic nephropathy: Secondary | ICD-10-CM

## 2016-07-22 LAB — CBC WITH DIFFERENTIAL/PLATELET
BASOS PCT: 0 %
Basophils Absolute: 0 10*3/uL (ref 0.0–0.1)
EOS ABS: 0.2 10*3/uL (ref 0.0–0.7)
EOS PCT: 3 %
HCT: 35.8 % — ABNORMAL LOW (ref 39.0–52.0)
Hemoglobin: 11.3 g/dL — ABNORMAL LOW (ref 13.0–17.0)
LYMPHS ABS: 2.8 10*3/uL (ref 0.7–4.0)
Lymphocytes Relative: 37 %
MCH: 22.9 pg — AB (ref 26.0–34.0)
MCHC: 31.6 g/dL (ref 30.0–36.0)
MCV: 72.5 fL — AB (ref 78.0–100.0)
MONO ABS: 0.4 10*3/uL (ref 0.1–1.0)
Monocytes Relative: 5 %
NEUTROS PCT: 55 %
Neutro Abs: 4.3 10*3/uL (ref 1.7–7.7)
PLATELETS: 226 10*3/uL (ref 150–400)
RBC: 4.94 MIL/uL (ref 4.22–5.81)
RDW: 14.2 % (ref 11.5–15.5)
WBC: 7.7 10*3/uL (ref 4.0–10.5)

## 2016-07-22 LAB — BASIC METABOLIC PANEL
Anion gap: 9 (ref 5–15)
BUN: 21 mg/dL — AB (ref 6–20)
CALCIUM: 8.7 mg/dL — AB (ref 8.9–10.3)
CO2: 22 mmol/L (ref 22–32)
CREATININE: 1.86 mg/dL — AB (ref 0.61–1.24)
Chloride: 105 mmol/L (ref 101–111)
GFR calc Af Amer: 45 mL/min — ABNORMAL LOW (ref >60)
GFR, EST NON AFRICAN AMERICAN: 39 mL/min — AB (ref >60)
GLUCOSE: 170 mg/dL — AB (ref 65–99)
Potassium: 3.6 mmol/L (ref 3.5–5.1)
SODIUM: 136 mmol/L (ref 135–145)

## 2016-07-22 LAB — IRON AND TIBC
IRON: 44 ug/dL — AB (ref 45–182)
SATURATION RATIOS: 18 % (ref 17.9–39.5)
TIBC: 248 ug/dL — AB (ref 250–450)
UIBC: 204 ug/dL

## 2016-07-22 LAB — HEMOGLOBIN A1C
Hgb A1c MFr Bld: 8.1 % — ABNORMAL HIGH (ref 4.8–5.6)
MEAN PLASMA GLUCOSE: 186 mg/dL

## 2016-07-22 LAB — MRSA PCR SCREENING: MRSA BY PCR: NEGATIVE

## 2016-07-22 LAB — GLUCOSE, CAPILLARY
GLUCOSE-CAPILLARY: 183 mg/dL — AB (ref 65–99)
GLUCOSE-CAPILLARY: 200 mg/dL — AB (ref 65–99)
Glucose-Capillary: 140 mg/dL — ABNORMAL HIGH (ref 65–99)
Glucose-Capillary: 184 mg/dL — ABNORMAL HIGH (ref 65–99)

## 2016-07-22 LAB — LIPID PANEL
CHOL/HDL RATIO: 9 ratio
CHOLESTEROL: 279 mg/dL — AB (ref 0–200)
HDL: 31 mg/dL — ABNORMAL LOW (ref >40)
LDL Cholesterol: UNDETERMINED mg/dL (ref 0–99)
Triglycerides: 537 mg/dL — ABNORMAL HIGH (ref <150)
VLDL: UNDETERMINED mg/dL (ref 0–40)

## 2016-07-22 LAB — TSH: TSH: 3.782 u[IU]/mL (ref 0.350–4.500)

## 2016-07-22 LAB — TROPONIN I
Troponin I: 0.05 ng/mL (ref <0.03)
Troponin I: 0.05 ng/mL (ref <0.03)
Troponin I: 0.04 ng/mL (ref <0.03)
Troponin I: 0.04 ng/mL (ref <0.03)

## 2016-07-22 LAB — RAPID URINE DRUG SCREEN, HOSP PERFORMED
Amphetamines: NOT DETECTED
BARBITURATES: NOT DETECTED
BENZODIAZEPINES: NOT DETECTED
COCAINE: NOT DETECTED
Opiates: NOT DETECTED
TETRAHYDROCANNABINOL: NOT DETECTED

## 2016-07-22 LAB — HIV ANTIBODY (ROUTINE TESTING W REFLEX): HIV Screen 4th Generation wRfx: NONREACTIVE

## 2016-07-22 MED ORDER — HYDRALAZINE HCL 20 MG/ML IJ SOLN
10.0000 mg | INTRAMUSCULAR | Status: DC | PRN
  Administered 2016-07-22 – 2016-07-23 (×2): 10 mg via INTRAVENOUS
  Filled 2016-07-22: qty 1

## 2016-07-22 MED ORDER — SODIUM CHLORIDE 0.9 % IV SOLN
INTRAVENOUS | Status: DC
Start: 2016-07-22 — End: 2016-07-22
  Administered 2016-07-22: 04:00:00 via INTRAVENOUS

## 2016-07-22 MED ORDER — ONDANSETRON HCL 4 MG/2ML IJ SOLN: 4.0000 mg | Freq: Four times a day (QID) | INTRAMUSCULAR | Status: DC | PRN

## 2016-07-22 MED ORDER — INSULIN ASPART 100 UNIT/ML ~~LOC~~ SOLN
0.0000 [IU] | Freq: Three times a day (TID) | SUBCUTANEOUS | Status: DC
  Administered 2016-07-22: 2 [IU] via SUBCUTANEOUS
  Administered 2016-07-22: 1 [IU] via SUBCUTANEOUS
  Administered 2016-07-22 – 2016-07-23 (×2): 2 [IU] via SUBCUTANEOUS
  Administered 2016-07-23: 3 [IU] via SUBCUTANEOUS

## 2016-07-22 MED ORDER — AMLODIPINE BESYLATE 5 MG PO TABS
5.0000 mg | ORAL_TABLET | Freq: Every day | ORAL | Status: DC
  Administered 2016-07-22: 5 mg via ORAL
  Filled 2016-07-22: qty 1

## 2016-07-22 MED ORDER — MORPHINE SULFATE (PF) 4 MG/ML IV SOLN: 2.0000 mg | INTRAVENOUS | Status: DC | PRN

## 2016-07-22 MED ORDER — ATORVASTATIN CALCIUM 40 MG PO TABS: 40.0000 mg | ORAL_TABLET | Freq: Every day | ORAL | Status: DC

## 2016-07-22 MED ORDER — CARVEDILOL 6.25 MG PO TABS
6.2500 mg | ORAL_TABLET | Freq: Two times a day (BID) | ORAL | Status: DC
  Administered 2016-07-22 (×2): 6.25 mg via ORAL
  Filled 2016-07-22 (×2): qty 1

## 2016-07-22 MED ORDER — ENOXAPARIN SODIUM 40 MG/0.4ML ~~LOC~~ SOLN
40.0000 mg | SUBCUTANEOUS | Status: DC
  Administered 2016-07-22: 40 mg via SUBCUTANEOUS
  Filled 2016-07-22 (×2): qty 0.4

## 2016-07-22 MED ORDER — GI COCKTAIL ~~LOC~~: 30.0000 mL | Freq: Four times a day (QID) | ORAL | Status: DC | PRN

## 2016-07-22 MED ORDER — ACETAMINOPHEN 325 MG PO TABS: 650.0000 mg | ORAL_TABLET | ORAL | Status: DC | PRN

## 2016-07-22 MED ORDER — FENOFIBRATE 54 MG PO TABS
54.0000 mg | ORAL_TABLET | Freq: Every day | ORAL | Status: DC
  Administered 2016-07-22 – 2016-07-23 (×2): 54 mg via ORAL
  Filled 2016-07-22 (×2): qty 1

## 2016-07-22 MED ORDER — ASPIRIN EC 325 MG PO TBEC
325.0000 mg | DELAYED_RELEASE_TABLET | Freq: Every day | ORAL | Status: DC
  Administered 2016-07-22 – 2016-07-23 (×2): 325 mg via ORAL
  Filled 2016-07-22 (×2): qty 1

## 2016-07-22 MED ORDER — INSULIN ASPART 100 UNIT/ML ~~LOC~~ SOLN: 0.0000 [IU] | Freq: Every day | SUBCUTANEOUS | Status: DC

## 2016-07-22 NOTE — Progress Notes (Addendum)
[facility]. [facility], [place] from [place], [place]. Oriented to room, call bell and staff. Bed in low position, fall safety plan reviewed, non-skid socks in place. Full assessment to Epic. BP remains elevated, though lower than it was at [facility], 170s/100s. Pt denies any chest pain or headache at this time. Dr. Toniann FailKakrakandy and cards fellow notified of patients arrival to unit and need for orders. Will continue to monitor closely.

## 2016-07-22 NOTE — H&P (Signed)
History and Physical    Philip Martin [id number] DOB: 01/31/1961 DOA: 07/21/2016  Referring MD/NP/PA: Dr. Toniann FailKakrakandy PCP: System, Pcp Not In  [facility]  Chief Complaint: Chest pain  HPI: Philip MolderDwayne Danowski is a 56 y.o. 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 Prior to onset of symptoms patient reports sneaking a cigarette. Shortly thereafter smoking the cigarette reported acute onset of right sided chest tightness for which he became diaphoretic and short of breath.  Symptoms lasted only a few minutes and self resolved. [facility], but refused to be admitted due to wanting to be closer to home. They had been on a trip visiting for graduation. Prior to the graduation ceremony ending patient noted having to leave due to feeling weak, seeing spots, and lightheadedness after being outside in the sun. After he was able to get to the car and put on the air conditioning he felt better. Since that time patient reports intermittent episodes of feeling like his heart is intermittently racing and he reports being slightly off balance. Denies any nausea, vomiting, leg swelling, calf pain, or falls. He reports that he was supposed quit smoking altogether but still intermittently sneaks 3-4 cigarettes per day.  ED Course: Upon admission to the emergency department patient was seen to be afebrile, heart rate 71-81, respirations 14-23, blood pressure elevated up to 232/116, and O2 saturations maintained on room air. Labs revealed hemoglobin 11.7, BUN 21, creatinine 2.18, glucose 162, troponin 0.05. Patient was given 10 mg of hydralazine and metoprolol IV while in the emergency department prior to transfer in attempts to control blood pressure.  Review of Systems: As per HPI otherwise 10 point  review of systems negative.   Past Medical History:  Diagnosis Date  . Diabetes mellitus without complication (HCC)   . Hypertension   . Renal disorder   . Stroke [facility](HCC)     History reviewed. No pertinent surgical history.   reports that he has been smoking.  He has never used smokeless tobacco. He reports that he does not drink alcohol or use drugs.  No Known Allergies  History reviewed. No pertinent family history.  Prior to Admission medications   Medication Sig Start Date End Date Taking? Authorizing Provider  aspirin 325 MG EC tablet Take 325 mg by mouth daily.   Yes [provider]  gabapentin (NEURONTIN) 300 MG capsule Take 300 mg by mouth 2 (two) times daily.    [provider]  HYDROcodone-acetaminophen (NORCO/VICODIN) 5-325 MG per tablet Take 1 tablet by mouth every 4 (four) hours as needed for moderate pain. 09/24/14   Azalia Bilisampos, Kevin, MD  ibuprofen (ADVIL,MOTRIN) 600 MG tablet Take 1 tablet (600 mg total) by mouth every 8 (eight) hours as needed. 09/24/14   Azalia Bilisampos, Kevin, MD  insulin NPH-regular Human (NOVOLIN 70/30) (70-30) 100 UNIT/ML injection Inject 20 Units into the skin.    [provider]  lisinopril (PRINIVIL,ZESTRIL) 10 MG tablet Take 10 mg by mouth daily.    [provider]    Physical Exam:    Constitutional: NAD, calm, comfortable Vitals:   07/22/16 0030 07/22/16 0117 07/22/16 0130 07/22/16 0145  BP: (!) 176/103 (!) 190/106 (!) 201/107 (!) 177/97  Pulse: 78 78 71 80  Resp: (!) 21 18 15 17   Temp: 98.2 F (36.8 C)  TempSrc: Oral     SpO2: 100% 98% 98% 98%  Weight: 85.1 kg (187 lb 11.2 oz)     Height: 5\' 11"  (1.803 m)      Eyes: PERRL, lids and conjunctivae normal ENMT: Mucous membranes are moist. Posterior pharynx clear of any exudate or lesions.Normal dentition.  Neck: normal, supple, no masses, no thyromegaly Respiratory: clear to auscultation bilaterally, no wheezing, no crackles. Normal respiratory effort. No  accessory muscle use.  Cardiovascular: Regular rate and rhythm, no murmurs / rubs / gallops. No extremity edema. 2+ pedal pulses. No carotid bruits.  Abdomen: no tenderness, no masses palpated. No hepatosplenomegaly. Bowel sounds positive.  Musculoskeletal: no clubbing / cyanosis. No joint deformity upper and lower extremities. Good ROM, no contractures. Normal muscle tone.  Skin: no rashes, lesions, ulcers. No induration Neurologic: CN 2-12 grossly intact. Sensation intact, DTR normal. Strength 5/5 in all 4 except the right upper extremity which is a 4/5. Patient has expressive aphasia  Psychiatric: Normal judgment and insight. Alert and oriented x 3. Normal mood.     Labs on Admission: I have personally reviewed following labs and imaging studies  CBC:  Recent Labs Lab 07/21/16 2019  WBC 7.2  NEUTROABS 3.9  HGB 11.7*  HCT 35.0*  MCV 72.8*  PLT 240   Basic Metabolic Panel:  Recent Labs Lab 07/21/16 2019  NA 138  K 3.8  CL 105  CO2 27  GLUCOSE 162*  BUN 21*  CREATININE 2.18*  CALCIUM 8.8*   GFR: Estimated Creatinine Clearance: 39.8 mL/min (A) (by C-G formula based on SCr of 2.18 mg/dL (H)). Liver Function Tests: No results for input(s): AST, ALT, ALKPHOS, BILITOT, PROT, ALBUMIN in the last 168 hours. No results for input(s): LIPASE, AMYLASE in the last 168 hours. No results for input(s): AMMONIA in the last 168 hours. Coagulation Profile: No results for input(s): INR, PROTIME in the last 168 hours. Cardiac Enzymes:  Recent Labs Lab 07/21/16 2018  TROPONINI 0.05*   BNP (last 3 results) No results for input(s): PROBNP in the last 8760 hours. HbA1C: No results for input(s): HGBA1C in the last 72 hours. CBG: No results for input(s): GLUCAP in the last 168 hours. Lipid Profile: No results for input(s): CHOL, HDL, LDLCALC, TRIG, CHOLHDL, LDLDIRECT in the last 72 hours. Thyroid Function Tests: No results for input(s): TSH, T4TOTAL, FREET4, T3FREE, THYROIDAB in  the last 72 hours. Anemia Panel: No results for input(s): VITAMINB12, FOLATE, FERRITIN, TIBC, IRON, RETICCTPCT in the last 72 hours. Urine analysis:    Component Value Date/Time   COLORURINE YELLOW 07/21/2016 2018   APPEARANCEUR CLEAR 07/21/2016 2018   LABSPEC 1.014 07/21/2016 2018   PHURINE 6.5 07/21/2016 2018   GLUCOSEU 100 (A) 07/21/2016 2018   HGBUR TRACE (A) 07/21/2016 2018   BILIRUBINUR NEGATIVE 07/21/2016 2018   KETONESUR NEGATIVE 07/21/2016 2018   PROTEINUR >300 (A) 07/21/2016 2018   NITRITE NEGATIVE 07/21/2016 2018   LEUKOCYTESUR NEGATIVE 07/21/2016 2018   Sepsis Labs: No results found for this or any previous visit (from the past 240 hour(s)).   Radiological Exams on Admission: Dg Chest 2 View  Result Date: 07/21/2016 CLINICAL DATA:  56 y/o  M; palpitations with headache. EXAM: CHEST  2 VIEW COMPARISON:  09/24/2014 chest radiograph. FINDINGS: Stable heart size and mediastinal contours are within normal limits. Both lungs are clear. The visualized skeletal structures are unremarkable. IMPRESSION: No active cardiopulmonary disease. Electronically Signed   By: Mitzi Hansen M.D.   On: 07/21/2016 23:39  EKG: Independently reviewed. Sinus rhythm with RBBB and LAFB  Assessment/Plan Chest pain with elevated troponin: Acute. Patient presents with complaints of chest pain and initial troponin 0.05 on admission. - Admit to telemetry bed - Trend cardiac enzymes - Check EKG - Check echocardiogram in a.m. - Cardiology to follow in a.m.   Intermittent palpitations: Acute. - Check TSH   - Follow-up telemetry overnight  Hypertensive urgency: Acute.  - Hydralazine IV  - Hold lisinopril 2/2 aki, but may warrant being placed back on lisinopril at some point due to proteinuria as seen on urinalysis  Acute kidney injury on chronic kidney disease stage III: Patient's baseline creatinine previously noted 1.5 -1.78 . Patient noted to have creatinine of 2.18 and a BUN 21.  Suspect this could secondary to dehydration given patient's other symptoms. - IVF NS at 75 ml/hr - Avoid nephrotoxic agents - Follow-up repeat BMP in a.m.  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 Patient noted to have stroke back in 09/2015.   Gait disturbance: Acute. Patient reports feeling off balance since Saturday. Per history patient may have become dehydrated. - Physical therapy to eval and treat - Consider need of CT scan of brain if symptoms persist despite fluid hydration  Microcytic microchromic anemia - Check TIBC and iron - Recheck CBC in a.m.  DVT prophylaxis:lovenox   Code Status: Full Family Communication: No family present at bedside Disposition Plan:  Consults called: Cards  Admission status: Inpatient  Clydie Braun MD [facility] Pager [phone]  If 7PM-7AM, please contact night-coverage [email or web address] Password [email or web address]  07/22/2016, 1:58 AM

## 2016-07-22 NOTE — Progress Notes (Signed)
Pt's BP remains elevated 190/106, repeat text page to Dr. Toniann FailKakrakandy for advisement and orders. Will continue to monitor closely.

## 2016-07-22 NOTE — Progress Notes (Signed)
PROGRESS NOTE        PATIENT DETAILS Name: Philip Martin Age: 56 y.o. Sex: male Date of Birth: 07-31-1960 Admit Date: 07/21/2016 Admitting Physician Eduard Clos, MD WUJ:[id number], Pcp Not In  Brief Narrative: Patient is a 56 y.o. male with history of hypertension, chronic kidney disease stage III, prior history of CVA (July 2017) with mild right upper extremity weakness, ongoing tobacco use-admitted for evaluation of chest pain. Patient was found to have uncontrolled hypertension, and admitted for further evaluation and treatment.  Subjective: Lying comfortably in bed-denies any chest pain.   Telemetry (independently reviewed): Normal sinus rhythm with occasional PVCs  Assessment/Plan: Chest pain: Mostly with atypical features-but has significant risk factors-cardiology consulted for further risk stratification. Nuclear stress test planned for 5/9.  Acute on chronic kidney disease stage III: Creatinine improving with supportive measures. Follow.  Uncontrolled hypertension: Started amlodipine and Coreg, follow and adjust accordingly.  Hypertriglyceridemia: Started on fenofibrate, will need repeat lipid panel in the next 3-6 months as outpatient.  Prior history of CVA: Continue aspirin. Optimize lipid control. Has mild right upper extremity weakness.  ? DM-2: A1c pending, CBGs currently stable with SSI.  DVT Prophylaxis: Prophylactic Lovenox   Code Status: Full code   Family Communication: None at bedside  Disposition Plan: Remain inpatient-transfer to telemetry  Antimicrobial agents: Anti-infectives    None      Procedures: None  CONSULTS:  cardiology  Time spent: 25- minutes-Greater than 50% of this time was spent in counseling, explanation of diagnosis, planning of further management, and coordination of care.  MEDICATIONS: Scheduled Meds: . amLODipine  5 mg Oral Daily  . aspirin  325 mg Oral Daily  . carvedilol  6.25 mg  Oral BID WC  . enoxaparin (LOVENOX) injection  40 mg Subcutaneous Q24H  . fenofibrate  54 mg Oral Q lunch  . insulin aspart  0-5 Units Subcutaneous QHS  . insulin aspart  0-9 Units Subcutaneous TID WC   Continuous Infusions: PRN Meds:.acetaminophen, gi cocktail, hydrALAZINE, morphine injection, ondansetron (ZOFRAN) IV   PHYSICAL EXAM: Vital signs: Vitals:   07/22/16 0630 07/22/16 0645 07/22/16 0847 07/22/16 1214  BP: (!) 180/91 (!) 176/92 (!) 167/90 (!) 146/93  Pulse: 80 79 70 76  Resp: 14 13 15 19   Temp:   98.5 F (36.9 C) 98.6 F (37 C)  TempSrc:   Oral Oral  SpO2: 94% 96% 90% 98%  Weight:      Height:       Filed Weights   07/21/16 1930 07/22/16 0030  Weight: 90.7 kg (200 lb) 85.1 kg (187 lb 11.2 oz)   Body mass index is 26.18 kg/m.   General appearance :Awake, alert, not in any distress. Speech Clear. Not toxic Looking Eyes:, pupils equally reactive to light and accomodation,no scleral icterus.Pink conjunctiva HEENT: Atraumatic and Normocephalic Neck: supple, no JVD. No cervical lymphadenopathy. No thyromegaly Resp:Good air entry bilaterally, no added sounds  CVS: S1 S2 regular, no murmurs.  GI: Bowel sounds present, Non tender and not distended with no gaurding, rigidity or rebound.No organomegaly Extremities: B/L Lower Ext shows no edema, both legs are warm to touch Neurology:  speech clear,Mild right upper extremity weakness-rest is 5/5.  Psychiatric: Normal judgment and insight. Alert and oriented x 3. Normal mood. Musculoskeletal:No digital cyanosis Skin:No Rash, warm and dry Wounds:N/A  I have personally reviewed following labs and  imaging studies  LABORATORY DATA: CBC:  Recent Labs Lab 07/21/16 2019 07/22/16 0443  WBC 7.2 7.7  NEUTROABS 3.9 4.3  HGB 11.7* 11.3*  HCT 35.0* 35.8*  MCV 72.8* 72.5*  PLT 240 226    Basic Metabolic Panel:  Recent Labs Lab 07/21/16 2019 07/22/16 0443  NA 138 136  K 3.8 3.6  CL 105 105  CO2 27 22  GLUCOSE  162* 170*  BUN 21* 21*  CREATININE 2.18* 1.86*  CALCIUM 8.8* 8.7*    GFR: Estimated Creatinine Clearance: 46.7 mL/min (A) (by C-G formula based on SCr of 1.86 mg/dL (H)).  Liver Function Tests: No results for input(s): AST, ALT, ALKPHOS, BILITOT, PROT, ALBUMIN in the last 168 hours. No results for input(s): LIPASE, AMYLASE in the last 168 hours. No results for input(s): AMMONIA in the last 168 hours.  Coagulation Profile: No results for input(s): INR, PROTIME in the last 168 hours.  Cardiac Enzymes:  Recent Labs Lab 07/21/16 2018 07/22/16 0301 07/22/16 0443 07/22/16 0735  TROPONINI 0.05* 0.05* 0.04* 0.04*    BNP (last 3 results) No results for input(s): PROBNP in the last 8760 hours.  HbA1C: No results for input(s): HGBA1C in the last 72 hours.  CBG:  Recent Labs Lab 07/22/16 0937 07/22/16 1217  GLUCAP 183* 140*    Lipid Profile:  Recent Labs  07/22/16 0301  CHOL 279*  HDL 31*  LDLCALC UNABLE TO CALCULATE IF TRIGLYCERIDE OVER 400 mg/dL  TRIG [id number]*  CHOLHDL 9.0    Thyroid Function Tests:  Recent Labs  07/22/16 0301  TSH 3.782    Anemia Panel:  Recent Labs  07/22/16 0443  TIBC 248*  IRON 44*    Urine analysis:    Component Value Date/Time   COLORURINE YELLOW 07/21/2016 2018   APPEARANCEUR CLEAR 07/21/2016 2018   LABSPEC 1.014 07/21/2016 2018   PHURINE 6.5 07/21/2016 2018   GLUCOSEU 100 (A) 07/21/2016 2018   HGBUR TRACE (A) 07/21/2016 2018   BILIRUBINUR NEGATIVE 07/21/2016 2018   KETONESUR NEGATIVE 07/21/2016 2018   PROTEINUR >300 (A) 07/21/2016 2018   NITRITE NEGATIVE 07/21/2016 2018   LEUKOCYTESUR NEGATIVE 07/21/2016 2018    Sepsis Labs: Lactic Acid, Venous No results found for: LATICACIDVEN  MICROBIOLOGY: Recent Results (from the past 240 hour(s))  MRSA PCR Screening     Status: None   Collection Time: 07/22/16 12:20 AM  Result Value Ref Range Status   MRSA by PCR NEGATIVE NEGATIVE Final    Comment:        The GeneXpert  MRSA Assay (FDA approved for NASAL specimens only), is one component of a comprehensive MRSA colonization surveillance program. It is not intended to diagnose MRSA infection nor to guide or monitor treatment for MRSA infections.     RADIOLOGY STUDIES/RESULTS: Dg Chest 2 View  Result Date: 07/21/2016 CLINICAL DATA:  56 y/o  M; palpitations with headache. EXAM: CHEST  2 VIEW COMPARISON:  09/24/2014 chest radiograph. FINDINGS: Stable heart size and mediastinal contours are within normal limits. Both lungs are clear. The visualized skeletal structures are unremarkable. IMPRESSION: No active cardiopulmonary disease. Electronically Signed   By: Mitzi HansenLance  Furusawa-Stratton M.D.   On: 07/21/2016 23:39     LOS: 0 days   Jeoffrey MassedShanker Shanon Seawright, MD  [facility] Pager:[phone]  If 7PM-7AM, please contact night-coverage [email or web address] Password [email or web address] 07/22/2016, 12:27 PM

## 2016-07-22 NOTE — Care Management Note (Signed)
Case Management Note  Patient Details  Name: Janus MolderDwayne Bachtel MRN: [id number] Date of Birth: 01/18/1961  Subjective/Objective:     Chest pain, scheduled for stress test on 07/23/2016                  Action/Plan: Discharge Planning:  NCM spoke to pt and states his Medicaid ran out. Sent message to Financial Counselor to follow up. Lives at home with wife, Trula OreChristina # [phone]. Pt may need assistance with meds and [facility]. Contacted wife via phone, no answer. Will continue to follow up for dc needs.    Expected Discharge Date:                  Expected Discharge Plan:  Home/Self Care  In-House Referral:  NA  Discharge planning Services  CM Consult   Status of Service:  In process, will continue to follow  If discussed at Long Length of Stay Meetings, dates discussed:    Additional Comments:  Elliot CousinShavis, Shondrea Steinert Ellen, RN 07/22/2016, 4:05 PM

## 2016-07-22 NOTE — Progress Notes (Signed)
RN informed by Dr. Kirtland BouchardK that Dr. Katrinka BlazingSmith will be taking care of patient, even though attending is listed as Dr. Toniann FailKakrakandy. Dr. Katrinka BlazingSmith paged and made aware of the aforementioned information, MD to place orders.

## 2016-07-22 NOTE — Evaluation (Signed)
Physical Therapy Evaluation Patient Details Name: Philip Martin MRN: [id number] DOB: 15-Dec-1960 Today's Date: 07/22/2016   History of Present Illness  Patient is a 56 y/o male who presents with chest pain. PMH includes HTN, DM type II, CKD stage III, CVA with residual expressive aphasia and RUE weakness. In ED, blood pressure was elevated up to 232/116. Admitted with hypertensive urgency and elevated troponin. Nuclear stress test planned for 5/9.  Clinical Impression  Patient presents with residual weakness RUE/LE from prior CVA however pt functioning at baseline. Tolerated higher level balance challenges- picking objects off floor and reaching outside BoS without difficulty or LOB. HR stable during activity. Pt does not require skilled therapy services due to above. Lives at home with spouse and is Mod I for ADLs. Discharge from therapy.     Follow Up Recommendations No PT follow up;Supervision - Intermittent    Equipment Recommendations  None recommended by PT    Recommendations for Other Services       Precautions / Restrictions Precautions Precautions: None Restrictions Weight Bearing Restrictions: No      Mobility  Bed Mobility Overal bed mobility: Independent                Transfers Overall transfer level: Independent Equipment used: None             General transfer comment: Stood from EOB without difficulty.   Ambulation/Gait Ambulation/Gait assistance: Modified independent (Device/Increase time);Independent Ambulation Distance (Feet): 400 Feet Assistive device: None Gait Pattern/deviations: Step-through pattern;Decreased stride length   Gait velocity interpretation: at or above normal speed for age/gender General Gait Details: Steady gait with mild drifting noted but no overt LOB or difficulty. Reports ambulation as baseline as pt with residual weakness RLE from prior CVA. HR stable 70-80s  Stairs            Wheelchair Mobility    Modified  Rankin (Stroke Patients Only)       Balance Overall balance assessment: Needs assistance Sitting-balance support: Feet supported;No upper extremity supported Sitting balance-Leahy Scale: Normal     Standing balance support: During functional activity Standing balance-Leahy Scale: Good Standing balance comment: Able to reach down to pick up small objects off floor x2, no LOB. Reach outside bOS and open curtains without difficulty.                              Pertinent Vitals/Pain Pain Assessment: No/denies pain    Home Living Family/patient expects to be discharged to:: Private residence Living Arrangements: Spouse/significant other Available Help at Discharge: Family;Available PRN/intermittently Type of Home: House         Home Equipment: None      Prior Function Level of Independence: Independent               Hand Dominance   Dominant Hand: Left    Extremity/Trunk Assessment   Upper Extremity Assessment Upper Extremity Assessment: Defer to OT evaluation;RUE deficits/detail RUE Deficits / Details: Residual deficits RUE from prior CVA in July of last year- hand not functional.    Lower Extremity Assessment Lower Extremity Assessment: RLE deficits/detail RLE Deficits / Details: Residual weakness RLE but functional.       Communication   Communication: Expressive difficulties  Cognition Arousal/Alertness: Awake/alert Behavior During Therapy: WFL for tasks assessed/performed Overall Cognitive Status: No family/caregiver present to determine baseline cognitive functioning Area of Impairment: Awareness  Awareness: Intellectual          General Comments      Exercises     Assessment/Plan    PT Assessment Patent does not need any further PT services  PT Problem List         PT Treatment Interventions      PT Goals (Current goals can be found in the Care Plan section)  Acute Rehab PT  Goals Patient Stated Goal: go home PT Goal Formulation: All assessment and education complete, DC therapy    Frequency     Barriers to discharge        Co-evaluation               AM-PAC PT "6 Clicks" Daily Activity  Outcome Measure Difficulty turning over in bed (including adjusting bedclothes, sheets and blankets)?: None Difficulty moving from lying on back to sitting on the side of the bed? : None Difficulty sitting down on and standing up from a chair with arms (e.g., wheelchair, bedside commode, etc,.)?: None Help needed moving to and from a bed to chair (including a wheelchair)?: None Help needed walking in hospital room?: None Help needed climbing 3-5 steps with a railing? : A Little 6 Click Score: 23    End of Session   Activity Tolerance: Patient tolerated treatment well Patient left: in bed;with call bell/phone within reach (sitting EOB eating lunch) Nurse Communication: Mobility status PT Visit Diagnosis: Hemiplegia and hemiparesis Hemiplegia - Right/Left: Right Hemiplegia - dominant/non-dominant: Non-dominant    Time: [id number] PT Time Calculation (min) (ACUTE ONLY): 19 min   Charges:   PT Evaluation $PT Eval Low Complexity: 1 Procedure     PT G Codes:   PT G-Codes **NOT FOR INPATIENT CLASS** Functional Assessment Tool Used: Clinical judgement Functional Limitation: Mobility: Walking and moving around Mobility: Walking and Moving Around Current Status ([id number]([id number]): At least 1 percent but less than 20 percent impaired, limited or restricted Mobility: Walking and Moving Around Goal Status [phone](G8979): At least 1 percent but less than 20 percent impaired, limited or restricted Mobility: Walking and Moving Around Discharge Status [phone](G8980): At least 1 percent but less than 20 percent impaired, limited or restricted    ViolaShauna Rees Santistevan, [place]PT, [place]PT [phone]    Marcy PanningShauna A Butch Otterson 07/22/2016, 2:38 PM

## 2016-07-22 NOTE — Consult Note (Signed)
Cardiology Consultation:   Patient ID: Philip Martin; [id number]; 11/18/1960   Admit date: 07/21/2016 Date of Consult: 07/22/2016  Primary Care Provider: System, Pcp Not In Primary Cardiologist: New to Dr. Rennis GoldenHilty   Patient Profile:   Philip Martin is a 56 y.o. male with a hx of HTN, CKD stage III, CVA with residual RUE weakness, DM, prior tobacco smoker who is being seen today for the evaluation of chest pain  at the request of Dr. Katrinka BlazingSmith.  History of Present Illness:   Philip Martin is not taking any medication currently due to lack of insurance. He stopped smoking tobacco since his CVA in July 2017. Greater than 40-pack-year history of tobacco smoking.  The patient [place]. Patient felt hot while in the stadium. It was very sunny day. He came home and try to smoke cigarette. He suddenly become dyspnei,  diaphoretic along with substernal chest tightness and palpitations. Symptoms lasted about 2 minutes and EMS was called and [facility]. Recommended admission however he refused and wanting to come back home (drove). He lay down mostly on his bed Sunday and Monday. No recurrent episode of chest tightness. He does had few episode of palpitation, lasting for few seconds. [facility]. Where he noted blood pressure of 232/116, creatinine of 2.18 mildly elevated troponin. Treated with IV antihypertensive and transfer [facility].  Patient denies any exertional symptoms. No orthopnea, PND, syncope, lower extremity edema, melena, blood in his stool or urine or dizziness. No family history of CAD.  Troponin 0.05-->0.05-->0.04-->0.04. TSH normal. UDS clear 07/22/2016: Cholesterol 279; HDL 31; LDL Cholesterol UNABLE TO CALCULATE IF TRIGLYCERIDE OVER 400 mg/dL; Triglycerides 537; VLDL UNABLE TO CALCULATE IF TRIGLYCERIDE OVER 400 mg/dL  EKG:  The EKG was  personally reviewed and demonstrates normal sinus rhythm at rate of 79 bpm. Right bundle branch block and LARB. No prior EKG to compare - personally reviewed.   Past Medical History:  Diagnosis Date  . Diabetes mellitus without complication (HCC)   . Hypertension   . Renal disorder   . Stroke [facility](HCC)     History reviewed. No pertinent surgical history.   Inpatient Medications: Scheduled Meds: . amLODipine  5 mg Oral Daily  . aspirin  325 mg Oral Daily  . carvedilol  6.25 mg Oral BID WC  . enoxaparin (LOVENOX) injection  40 mg Subcutaneous Q24H  . insulin aspart  0-5 Units Subcutaneous QHS  . insulin aspart  0-9 Units Subcutaneous TID WC   Continuous Infusions:  PRN Meds: acetaminophen, gi cocktail, hydrALAZINE, morphine injection, ondansetron (ZOFRAN) IV  Allergies:   No Known Allergies  Social History:   Social History   Social History  . Marital status: Married    Spouse name: N/A  . Number of children: N/A  . Years of education: N/A   Occupational History  . Not on file.   Social History Main Topics  . Smoking status: Current Every Day Smoker  . Smokeless tobacco: Never Used  . Alcohol use No  . Drug use: No  . Sexual activity: Not on file   Other Topics Concern  . Not on file   Social History Narrative  . No narrative on file    Family History:   The patient's family history is not on file.  Patient denies any family history of CAD, stroke, hypertension or diabetes.  ROS:  Please  see the history of present illness.  All other ROS reviewed and negative.     Physical Exam/Data:   Vitals:   07/22/16 0615 07/22/16 0630 07/22/16 0645 07/22/16 0847  BP: (!) 163/91 (!) 180/91 (!) 176/92 (!) 167/90  Pulse: 86 80 79 70  Resp: 14 14 13 15   Temp:    98.5 F (36.9 C)  TempSrc:    Oral  SpO2: 96% 94% 96% 90%  Weight:      Height:        Intake/Output Summary (Last 24 hours) at 07/22/16 1008 Last data filed at 07/22/16 0848  Gross per 24 hour  Intake            256.25 ml  Output              675 ml  Net          -418.75 ml   Filed Weights   07/21/16 1930 07/22/16 0030  Weight: 200 lb (90.7 kg) 187 lb 11.2 oz (85.1 kg)   Body mass index is 26.18 kg/m.  General:  Well nourished, well developed, in no acute distress HEENT: normal Lymph: no adenopathy Neck: no JVD Endocrine:  No thryomegaly Vascular: No carotid bruits; FA pulses 2+ bilaterally without bruits  Cardiac:  normal S1, S2; RRR; no murmur  Lungs:  clear to auscultation bilaterally, no wheezing, rhonchi or rales  Abd: soft, nontender, no hepatomegaly  Ext: no edema Musculoskeletal:  No deformities, BUE and BLE strength normal and equal Skin: warm and dry  Neuro:  CNs 2-12 intact, no focal abnormalities noted Psych:  Normal affect    Laboratory Data:  Chemistry Recent Labs Lab 07/21/16 2019 07/22/16 0443  NA 138 136  K 3.8 3.6  CL 105 105  CO2 27 22  GLUCOSE 162* 170*  BUN 21* 21*  CREATININE 2.18* 1.86*  CALCIUM 8.8* 8.7*  GFRNONAA 32* 39*  GFRAA 37* 45*  ANIONGAP 6 9    No results for input(s): PROT, ALBUMIN, AST, ALT, ALKPHOS, BILITOT in the last 168 hours. Hematology Recent Labs Lab 07/21/16 2019 07/22/16 0443  WBC 7.2 7.7  RBC 4.81 4.94  HGB 11.7* 11.3*  HCT 35.0* 35.8*  MCV 72.8* 72.5*  MCH 24.3* 22.9*  MCHC 33.4 31.6  RDW 14.6 14.2  PLT 240 226   Cardiac Enzymes Recent Labs Lab 07/21/16 2018 07/22/16 0301 07/22/16 0443 07/22/16 0735  TROPONINI 0.05* 0.05* 0.04* 0.04*   No results for input(s): TROPIPOC in the last 168 hours.  BNPNo results for input(s): BNP, PROBNP in the last 168 hours.  DDimer No results for input(s): DDIMER in the last 168 hours.  Radiology/Studies:  Dg Chest 2 View  Result Date: 07/21/2016 CLINICAL DATA:  56 y/o  M; palpitations with headache. EXAM: CHEST  2 VIEW COMPARISON:  09/24/2014 chest radiograph. FINDINGS: Stable heart size and mediastinal contours are within normal limits. Both lungs are clear. The  visualized skeletal structures are unremarkable. IMPRESSION: No active cardiopulmonary disease. Electronically Signed   By: Mitzi Hansen M.D.   On: 07/21/2016 23:39    Assessment and Plan:   1. Chest tightness - Episode occurred while smoking cigarettes lasting for less than 2 minutes. No recurrent event. Differential includes a AKI vs hypertensive urgency.  - Troponin flat trend. EKG without acute ischemic changes. However he has RBBB and LAFB. He will benefit from stress test at some time. His cardiac risk factors includes prior tobacco abuse, stroke, untreated hypertension and diabetes. UDS clear.  2. Hypertensive urgency - Minimally improved. Uptitrate medications as needed after few readings.   3. DM - Per primary team. Pending A1c.   4. HLD - 07/22/2016: Cholesterol 279; HDL 31; LDL Cholesterol UNABLE TO CALCULATE IF TRIGLYCERIDE OVER 400 mg/dL; Triglycerides 537; VLDL UNABLE TO CALCULATE IF TRIGLYCERIDE OVER 400 mg/dL  - Likely due to untreated DM. Will start fibrates.   5. Palpitations - TSH normal. Pending echo. Continue to monitor on telemetry.   Dr. Rennis Golden to see.   Lorelei Pont, PA  07/22/2016 10:08 AM

## 2016-07-23 ENCOUNTER — Observation Stay (HOSPITAL_BASED_OUTPATIENT_CLINIC_OR_DEPARTMENT_OTHER): Payer: Self-pay

## 2016-07-23 DIAGNOSIS — R072 Precordial pain: Secondary | ICD-10-CM

## 2016-07-23 DIAGNOSIS — R9439 Abnormal result of other cardiovascular function study: Secondary | ICD-10-CM

## 2016-07-23 DIAGNOSIS — R079 Chest pain, unspecified: Secondary | ICD-10-CM

## 2016-07-23 LAB — NM MYOCAR MULTI W/SPECT W/WALL MOTION / EF
CSEPEDS: 0 s
CSEPEW: 1 METS
Exercise duration (min): 0 min
MPHR: 163 {beats}/min
Peak HR: 96 {beats}/min
Percent HR: 58 %
Rest HR: 75 {beats}/min

## 2016-07-23 LAB — BASIC METABOLIC PANEL
ANION GAP: 6 (ref 5–15)
BUN: 27 mg/dL — AB (ref 6–20)
CHLORIDE: 106 mmol/L (ref 101–111)
CO2: 24 mmol/L (ref 22–32)
Calcium: 8.7 mg/dL — ABNORMAL LOW (ref 8.9–10.3)
Creatinine, Ser: 2.12 mg/dL — ABNORMAL HIGH (ref 0.61–1.24)
GFR calc Af Amer: 38 mL/min — ABNORMAL LOW (ref >60)
GFR calc non Af Amer: 33 mL/min — ABNORMAL LOW (ref >60)
Glucose, Bld: 187 mg/dL — ABNORMAL HIGH (ref 65–99)
POTASSIUM: 3.7 mmol/L (ref 3.5–5.1)
SODIUM: 136 mmol/L (ref 135–145)

## 2016-07-23 LAB — GLUCOSE, CAPILLARY
GLUCOSE-CAPILLARY: 213 mg/dL — AB (ref 65–99)
GLUCOSE-CAPILLARY: 193 mg/dL — AB (ref 65–99)
Glucose-Capillary: 175 mg/dL — ABNORMAL HIGH (ref 65–99)

## 2016-07-23 LAB — ECHOCARDIOGRAM COMPLETE
Height: 71 in
WEIGHTICAEL: 2993.6 [oz_av]

## 2016-07-23 MED ORDER — ATORVASTATIN CALCIUM 40 MG PO TABS
40.0000 mg | ORAL_TABLET | Freq: Every day | ORAL | Status: DC
  Administered 2016-07-23: 40 mg via ORAL
  Filled 2016-07-23: qty 1

## 2016-07-23 MED ORDER — BLOOD GLUCOSE MONITOR KIT: PACK | 0 refills | Status: AC

## 2016-07-23 MED ORDER — LINAGLIPTIN 5 MG PO TABS: 5.0000 mg | ORAL_TABLET | Freq: Every day | ORAL | 0 refills | Status: DC

## 2016-07-23 MED ORDER — REGADENOSON 0.4 MG/5ML IV SOLN
0.4000 mg | Freq: Once | INTRAVENOUS | Status: AC
  Administered 2016-07-23: 0.4 mg via INTRAVENOUS
  Filled 2016-07-23: qty 5

## 2016-07-23 MED ORDER — CARVEDILOL 12.5 MG PO TABS
12.5000 mg | ORAL_TABLET | Freq: Two times a day (BID) | ORAL | Status: DC
  Administered 2016-07-23: 12.5 mg via ORAL
  Filled 2016-07-23: qty 1

## 2016-07-23 MED ORDER — TECHNETIUM TC 99M TETROFOSMIN IV KIT
10.0000 | PACK | Freq: Once | INTRAVENOUS | Status: AC | PRN
  Administered 2016-07-23: 10 via INTRAVENOUS

## 2016-07-23 MED ORDER — ATORVASTATIN CALCIUM 40 MG PO TABS: 40.0000 mg | ORAL_TABLET | Freq: Every day | ORAL | 0 refills | Status: AC

## 2016-07-23 MED ORDER — AMLODIPINE BESYLATE 10 MG PO TABS
10.0000 mg | ORAL_TABLET | Freq: Every day | ORAL | Status: DC
Start: 2016-07-23 — End: 2016-07-24
  Administered 2016-07-23: 10 mg via ORAL
  Filled 2016-07-23: qty 1

## 2016-07-23 MED ORDER — AMLODIPINE BESYLATE 10 MG PO TABS: 10.0000 mg | ORAL_TABLET | Freq: Every day | ORAL | 0 refills | Status: AC

## 2016-07-23 MED ORDER — REGADENOSON 0.4 MG/5ML IV SOLN
INTRAVENOUS | Status: AC
  Administered 2016-07-23: 0.4 mg via INTRAVENOUS
  Filled 2016-07-23: qty 5

## 2016-07-23 MED ORDER — HYDRALAZINE HCL 20 MG/ML IJ SOLN
INTRAMUSCULAR | Status: AC
  Filled 2016-07-23: qty 1

## 2016-07-23 MED ORDER — FENOFIBRATE 54 MG PO TABS: 54.0000 mg | ORAL_TABLET | Freq: Every day | ORAL | 0 refills | Status: AC

## 2016-07-23 MED ORDER — TECHNETIUM TC 99M TETROFOSMIN IV KIT
30.0000 | PACK | Freq: Once | INTRAVENOUS | Status: AC | PRN
  Administered 2016-07-23: 30 via INTRAVENOUS

## 2016-07-23 MED ORDER — CARVEDILOL 12.5 MG PO TABS: 12.5000 mg | ORAL_TABLET | Freq: Two times a day (BID) | ORAL | 0 refills | Status: DC

## 2016-07-23 NOTE — Progress Notes (Signed)
   Stress test results as below:   IMPRESSION: 1. Large scar of the inferior wall and cardiac apex.  2. Lateral hypokinesis with poor wall thickening of the cardiac apex.  3. Left ventricular ejection fraction 45%  4. Non invasive risk stratification*: High (due to the large scar and moderate elevated end-systolic left ventricular volume).  Reviewed images with Dr. Eden EmmsNishan. Scar noted but no ischemia. Echo showed a preserved EF of 60-65%, Grade 1 DD, and no regional WMA. With his elevated creatinine, would favor medical therapy at this time. Continue newly started Amlodipine 10mg  daily, Coreg 12.5 mg BID, statin therapy, and ASA (high-dose due to history of CVA). Will arrange for close Cardiology follow-up.   Signed, Ellsworth LennoxBrittany M Tanja Gift, PA-C 07/23/2016, 3:54 PM Pager: [phone]

## 2016-07-23 NOTE — Progress Notes (Addendum)
Progress Note  Patient Name: Philip Martin Date of Encounter: 07/23/2016  Primary Cardiologist: New - Dr. Rennis Golden  Subjective   He denies any recurrent chest pain. No dyspnea or palpitations. Seen in Nuclear Medicine for 1-day NST.   Inpatient Medications    Scheduled Meds: . amLODipine  5 mg Oral Daily  . aspirin  325 mg Oral Daily  . carvedilol  6.25 mg Oral BID WC  . enoxaparin (LOVENOX) injection  40 mg Subcutaneous Q24H  . fenofibrate  54 mg Oral Q lunch  . insulin aspart  0-5 Units Subcutaneous QHS  . insulin aspart  0-9 Units Subcutaneous TID WC   Continuous Infusions:  PRN Meds: acetaminophen, gi cocktail, hydrALAZINE, morphine injection, ondansetron (ZOFRAN) IV   Vital Signs    Vitals:   07/22/16 1622 07/22/16 1656 07/22/16 2100 07/23/16 0540  BP: (!) 148/83 (!) 158/97 (!) 172/91 (!) 182/94  Pulse: 83 80 80 78  Resp: (!) 22 18 15 16   Temp: 98.5 F (36.9 C) 98.2 F (36.8 C) 98.5 F (36.9 C) 98.7 F (37.1 C)  TempSrc: Oral Oral Oral Oral  SpO2: 99% 99% 99% 98%  Weight:    187 lb 1.6 oz (84.9 kg)  Height:        Intake/Output Summary (Last 24 hours) at 07/23/16 0739 Last data filed at 07/23/16 0500  Gross per 24 hour  Intake             1076 ml  Output              750 ml  Net              326 ml   Filed Weights   07/21/16 1930 07/22/16 0030 07/23/16 0540  Weight: 200 lb (90.7 kg) 187 lb 11.2 oz (85.1 kg) 187 lb 1.6 oz (84.9 kg)     Physical Exam   General: Well developed, well nourished African American male appearing in no acute distress. Head: Normocephalic, atraumatic.  Neck: Supple without bruits, JVD not elevated. Lungs:  Resp regular and unlabored, CTA without wheezing or rales. Heart: RRR, S1, S2, no S3, S4, or murmur; no rub. Abdomen: Soft, non-tender, non-distended with normoactive bowel sounds. No hepatomegaly. No rebound/guarding. No obvious abdominal masses. Extremities: No clubbing, cyanosis, or lower extremity edema. Distal pedal  pulses are 2+ bilaterally. Neuro: Alert and oriented X 3. Moves all extremities spontaneously. Psych: Normal affect.  Labs    Chemistry Recent Labs Lab 07/21/16 2019 07/22/16 0443 07/23/16 0534  NA 138 136 136  K 3.8 3.6 3.7  CL 105 105 106  CO2 27 22 24   GLUCOSE 162* 170* 187*  BUN 21* 21* 27*  CREATININE 2.18* 1.86* 2.12*  CALCIUM 8.8* 8.7* 8.7*  GFRNONAA 32* 39* 33*  GFRAA 37* 45* 38*  ANIONGAP 6 9 6      Hematology Recent Labs Lab 07/21/16 2019 07/22/16 0443  WBC 7.2 7.7  RBC 4.81 4.94  HGB 11.7* 11.3*  HCT 35.0* 35.8*  MCV 72.8* 72.5*  MCH 24.3* 22.9*  MCHC 33.4 31.6  RDW 14.6 14.2  PLT 240 226    Cardiac Enzymes Recent Labs Lab 07/21/16 2018 07/22/16 0301 07/22/16 0443 07/22/16 0735  TROPONINI 0.05* 0.05* 0.04* 0.04*   No results for input(s): TROPIPOC in the last 168 hours.   BNPNo results for input(s): BNP, PROBNP in the last 168 hours.   DDimer No results for input(s): DDIMER in the last 168 hours.   Radiology    Dg  Chest 2 View  Result Date: 07/21/2016 CLINICAL DATA:  56 y/o  M; palpitations with headache. EXAM: CHEST  2 VIEW COMPARISON:  09/24/2014 chest radiograph. FINDINGS: Stable heart size and mediastinal contours are within normal limits. Both lungs are clear. The visualized skeletal structures are unremarkable. IMPRESSION: No active cardiopulmonary disease. Electronically Signed   By: Mitzi HansenLance  Furusawa-Stratton M.D.   On: 07/21/2016 23:39    Telemetry    Not reviewed. Seen in Nuclear Medicine.   ECG    EKG 07/22/16: NSR, RBBB, LAFB - Personally Reviewed  Cardiac Studies   Echocardiogram 07/22/16: Pending  Patient Profile     56 y.o. male with PMH of HTN, CKD stage III, CVA with residual RUE weakness, DM, and tobacco use who [facility]2016 for evaluation of chest pain.   Assessment & Plan    1. Chest tightness - Episode occurred while smoking cigarettes, lasting for less than 2 minutes. No recurrent events.  Troponin with flat trend and EKG showing RBBB and LAFB (no prior tracings available for comparison).  - Cardiac risk factors includes prior tobacco abuse, CVA, untreated HTN and diabetes. - 1-day Nuclear Stress Test has been performed, images and final report pending. Being [facility]. If low-risk, no further ischemic evaluation indicated at this time.   2. Hypertensive urgency - BP remains elevated at 146/52 - 203/108 in the past 24 hours.  - will increase Amlodipine from 5mg  daily to 10 mg daily and increase Coreg to 12.5 mg BID  3. Type 2 DM - Hgb A1c: 8.1 - per admitting team  4. HLD - Lipid Panel this admission shows total Cholesterol 279; HDL 31; LDL unable to be calculated - Likely due to untreated DM. Continue Fenofibrate. Has been started on Lipitor 40mg  daily.   5. Palpitations - TSH normal. Continue to monitor on telemetry.  - echo pending  6. Stage 3 CKD - creatinine at 2.12. Avoid cardiac cath unless stress test is high-risk to reduce the risk of contrast-induced nephropathy.   Signed, Ellsworth LennoxBrittany M Emerlyn Mehlhoff, PA-C 07/23/2016, 9:47 AM Pager: [phone]

## 2016-07-23 NOTE — Progress Notes (Signed)
  Echocardiogram 2D Echocardiogram has been performed.  Nolon RodBrown, Tony 07/23/2016, 3:39 PM

## 2016-07-23 NOTE — Discharge Summary (Signed)
Physician Discharge Summary  Philip Martin QPR:[id number] DOB: 1960/08/20 DOA: 07/21/2016  PCP: System, Pcp Not In  Admit date: 07/21/2016 Discharge date: 07/23/2016   Recommendations for Outpatient Follow-Up:   1. Needs established with PCP repeat lipid panel in the next 3-6 months as outpatient   Discharge Diagnosis:   Principal Problem:   Chest pain Active Problems:   History of CVA (cerebrovascular accident)   Gait disturbance   Diabetes mellitus type II, uncontrolled (HCC)   AKI (acute kidney injury) ([place])   Hypertensive urgency   Elevated troponin   Discharge disposition:  Home.  Discharge Condition: Improved.  Diet recommendation: Low sodium, heart healthy.  Carbohydrate-modified.  Wound care: None.   History of Present Illness:   Philip Martin is a 56 y.o. 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 Prior to onset of symptoms patient reports sneaking a cigarette. Shortly thereafter smoking the cigarette reported acute onset of right sided chest tightness for which he became diaphoretic and short of breath.  Symptoms lasted only a few minutes and self resolved. He was evaluated at [facility], but refused to be admitted due to wanting to be closer to home. They had been on a trip visiting for graduation. Prior to the graduation ceremony ending patient noted having to leave due to feeling weak, seeing spots, and lightheadedness after being outside in the sun. After he was able to get to the car and put on the air conditioning he felt better. Since that time patient reports intermittent episodes of feeling like his heart is intermittently racing and he reports being slightly off balance. Denies any nausea, vomiting, leg swelling, calf pain, or falls. He reports that he was supposed quit smoking  altogether but still intermittently sneaks 3-4 cigarettes per day.   Hospital Course by Problem:   Chest pain:  -high risk stress test-- cardiology recommends:Reviewed images with Dr. Johnsie Cancel. Scar noted but no ischemia. Echo showed a preserved EF of 60-65%, Grade 1 DD, and no regional WMA. With his elevated creatinine, would favor medical therapy at this time. Continue newly started Amlodipine 52m daily, Coreg 12.5 mg BID, statin therapy, and ASA (high-dose due to history of CVA). Will arrange for close Cardiology follow-up.    Acute on chronic kidney disease stage III:  -outpatient follow up  Uncontrolled hypertension: continue BP meds  Hypertriglyceridemia: Started on fenofibrate, will need repeat lipid panel in the next 3-6 months as outpatient.  Prior history of CVA: Continue aspirin. Optimize lipid control. Has mild right upper extremity weakness.  ? DM-2: A1c 8.1--add tradjenta as CKD-- titate as outpatient    Medical Consultants:    cards   Discharge Exam:   Vitals:   07/23/16 0918 07/23/16 1403  BP: (!) 168/80 (!) 144/85  Pulse: 89 89  Resp:  19  Temp:  97.9 F (36.6 C)   Vitals:   07/23/16 0915 07/23/16 0917 07/23/16 0918 07/23/16 1403  BP: (!) 152/73 (!) 161/52 (!) 168/80 (!) 144/85  Pulse: 93 91 89 89  Resp:    19  Temp:    97.9 F (36.6 C)  TempSrc:    Oral  SpO2:    99%  Weight:      Height:        Gen:  NAD    The results of significant diagnostics from this hospitalization (  including imaging, microbiology, ancillary and laboratory) are listed below for reference.     Procedures and Diagnostic Studies:   Dg Chest 2 View  Result Date: 07/21/2016 CLINICAL DATA:  56 y/o  M; palpitations with headache. EXAM: CHEST  2 VIEW COMPARISON:  09/24/2014 chest radiograph. FINDINGS: Stable heart size and mediastinal contours are within normal limits. Both lungs are clear. The visualized skeletal structures are unremarkable. IMPRESSION: No active  cardiopulmonary disease. Electronically Signed   By: Kristine Garbe M.D.   On: 07/21/2016 23:39     Labs:   Basic Metabolic Panel:  Recent Labs Lab 07/21/16 2019 07/22/16 0443 07/23/16 0534  NA 138 136 136  K 3.8 3.6 3.7  CL 105 105 106  CO2 27 22 24   GLUCOSE 162* 170* 187*  BUN 21* 21* 27*  CREATININE 2.18* 1.86* 2.12*  CALCIUM 8.8* 8.7* 8.7*   GFR Estimated Creatinine Clearance: 40.9 mL/min (A) (by C-G formula based on SCr of 2.12 mg/dL (H)). Liver Function Tests: No results for input(s): AST, ALT, ALKPHOS, BILITOT, PROT, ALBUMIN in the last 168 hours. No results for input(s): LIPASE, AMYLASE in the last 168 hours. No results for input(s): AMMONIA in the last 168 hours. Coagulation profile No results for input(s): INR, PROTIME in the last 168 hours.  CBC:  Recent Labs Lab 07/21/16 2019 07/22/16 0443  WBC 7.2 7.7  NEUTROABS 3.9 4.3  HGB 11.7* 11.3*  HCT 35.0* 35.8*  MCV 72.8* 72.5*  PLT 240 226   Cardiac Enzymes:  Recent Labs Lab 07/21/16 2018 07/22/16 0301 07/22/16 0443 07/22/16 0735  TROPONINI 0.05* 0.05* 0.04* 0.04*   BNP: Invalid input(s): POCBNP CBG:  Recent Labs Lab 07/22/16 1621 07/22/16 2133 07/23/16 0735 07/23/16 1039 07/23/16 1609  GLUCAP 184* 200* 175* 213* 193*   D-Dimer No results for input(s): DDIMER in the last 72 hours. Hgb A1c  Recent Labs  07/22/16 0301  HGBA1C 8.1*   Lipid Profile  Recent Labs  07/22/16 0301  CHOL 279*  HDL 31*  LDLCALC UNABLE TO CALCULATE IF TRIGLYCERIDE OVER 400 mg/dL  TRIG 537*  CHOLHDL 9.0   Thyroid function studies  Recent Labs  07/22/16 0301  TSH 3.782   Anemia work up  Recent Labs  07/22/16 0443  TIBC 248*  IRON 44*   Microbiology Recent Results (from the past 240 hour(s))  MRSA PCR Screening     Status: None   Collection Time: 07/22/16 12:20 AM  Result Value Ref Range Status   MRSA by PCR NEGATIVE NEGATIVE Final    Comment:        The GeneXpert MRSA Assay  (FDA approved for NASAL specimens only), is one component of a comprehensive MRSA colonization surveillance program. It is not intended to diagnose MRSA infection nor to guide or monitor treatment for MRSA infections.      Discharge Instructions:   Discharge Instructions    Diet - low sodium heart healthy    Complete by:  As directed    Diet Carb Modified    Complete by:  As directed    Discharge instructions    Complete by:  As directed    BMP 1 week Check blood sugars- bring log to PCP   Increase activity slowly    Complete by:  As directed      Allergies as of 07/23/2016   No Known Allergies     Medication List    STOP taking these medications   HYDROcodone-acetaminophen 5-325 MG tablet Commonly known as:  NORCO/VICODIN   ibuprofen 600 MG tablet Commonly known as:  ADVIL,MOTRIN     TAKE these medications   amLODipine 10 MG tablet Commonly known as:  NORVASC Take 1 tablet (10 mg total) by mouth daily. Start taking on:  07/24/2016   aspirin 325 MG EC tablet Take 325 mg by mouth daily.   atorvastatin 40 MG tablet Commonly known as:  LIPITOR Take 1 tablet (40 mg total) by mouth daily at 6 PM.   blood glucose meter kit and supplies Kit Dispense based on patient and insurance preference. Use up to four times daily as directed. (FOR ICD-9 250.00, 250.01).   carvedilol 12.5 MG tablet Commonly known as:  COREG Take 1 tablet (12.5 mg total) by mouth 2 (two) times daily with a meal.   fenofibrate 54 MG tablet Take 1 tablet (54 mg total) by mouth daily with lunch. Start taking on:  07/24/2016   linagliptin 5 MG Tabs tablet Commonly known as:  TRADJENTA Take 1 tablet (5 mg total) by mouth daily.      Follow-up Information    Pixie Casino, MD Follow up on 07/31/2016.   Specialty:  Cardiology Why:  Cardiology Hospital follow-up on 07/31/2016 at 4:20PM.  Contact information: [place] [address] [phone]             Time coordinating discharge: 25 min  Signed:  Levan   [facility] 07/23/2016, 4:39 PM

## 2016-07-23 NOTE — Progress Notes (Signed)
RN reviewed discharge instructions with patient and patient's wife, all questions answered.  Patient took all belongings at time of discharge.  Patient transported to emergency department entrance per RN via wheelchair.  Patient assisted into private car per RN.

## 2016-07-23 NOTE — Progress Notes (Signed)
PROGRESS NOTE        PATIENT DETAILS Name: Greggory Safranek Age: 56 y.o. Sex: male Date of Birth: 11/19/1960 Admit Date: 07/21/2016 Admitting Physician Eduard Clos, MD ZOX:[id number], Pcp Not In  Brief Narrative: Patient is a 56 y.o. male with history of hypertension, chronic kidney disease stage III, prior history of CVA (July 2017) with mild right upper extremity weakness, ongoing tobacco use-admitted for evaluation of chest pain. Patient was found to have uncontrolled hypertension, and admitted for further evaluation and treatment.  Subjective: Walking to bathroom  Assessment/Plan: Chest pain:  -high risk stress test Await cardiology recommendations  Acute on chronic kidney disease stage III:  -trend  Uncontrolled hypertension: Started amlodipine and Coreg, follow and adjust accordingly.  Hypertriglyceridemia: Started on fenofibrate, will need repeat lipid panel in the next 3-6 months as outpatient.  Prior history of CVA: Continue aspirin. Optimize lipid control. Has mild right upper extremity weakness.  ? DM-2: A1c 8.1-- will need titration of medications  DVT Prophylaxis: Prophylactic Lovenox   Code Status: Full code   Family Communication: None at bedside  Disposition Plan: Per cards  Antimicrobial agents: Anti-infectives    None      Procedures: Stress test- high risk  CONSULTS:  cardiology  Time spent: 25- minutes-Greater than 50% of this time was spent in counseling, explanation of diagnosis, planning of further management, and coordination of care.  MEDICATIONS: Scheduled Meds: . amLODipine  10 mg Oral Daily  . aspirin  325 mg Oral Daily  . atorvastatin  40 mg Oral q1800  . carvedilol  12.5 mg Oral BID WC  . enoxaparin (LOVENOX) injection  40 mg Subcutaneous Q24H  . fenofibrate  54 mg Oral Q lunch  . insulin aspart  0-5 Units Subcutaneous QHS  . insulin aspart  0-9 Units Subcutaneous TID WC   Continuous  Infusions: PRN Meds:.acetaminophen, gi cocktail, hydrALAZINE, morphine injection, ondansetron (ZOFRAN) IV   PHYSICAL EXAM: Vital signs: Vitals:   07/23/16 0915 07/23/16 0917 07/23/16 0918 07/23/16 1403  BP: (!) 152/73 (!) 161/52 (!) 168/80 (!) 144/85  Pulse: 93 91 89 89  Resp:    19  Temp:    97.9 F (36.6 C)  TempSrc:    Oral  SpO2:    99%  Weight:      Height:       Filed Weights   07/21/16 1930 07/22/16 0030 07/23/16 0540  Weight: 90.7 kg (200 lb) 85.1 kg (187 lb 11.2 oz) 84.9 kg (187 lb 1.6 oz)   Body mass index is 26.1 kg/m.   General appearance :Awake, alert, not in any distress. Speech Clear. Not toxic Looking Resp:Good air entry bilaterally, no added sounds  CVS: S1 S2 regular, no murmurs.  GI: Bowel sounds present, Non tender and not distended with no gaurding, rigidity or rebound.No organomegaly Extremities: B/L Lower Ext shows no edema, both legs are warm to touch Neurology:  speech clear,Mild right upper extremity weakness    LABORATORY DATA: CBC:  Recent Labs Lab 07/21/16 2019 07/22/16 0443  WBC 7.2 7.7  NEUTROABS 3.9 4.3  HGB 11.7* 11.3*  HCT 35.0* 35.8*  MCV 72.8* 72.5*  PLT 240 226    Basic Metabolic Panel:  Recent Labs Lab 07/21/16 2019 07/22/16 0443 07/23/16 0534  NA 138 136 136  K 3.8 3.6 3.7  CL 105 105 106  CO2 27  22 24  GLUCOSE 162* 170* 187*  BUN 21* 21* 27*  CREATININE 2.18* 1.86* 2.12*  CALCIUM 8.8* 8.7* 8.7*    GFR: Estimated Creatinine Clearance: 40.9 mL/min (A) (by C-G formula based on SCr of 2.12 mg/dL (H)).  Liver Function Tests: No results for input(s): AST, ALT, ALKPHOS, BILITOT, PROT, ALBUMIN in the last 168 hours. No results for input(s): LIPASE, AMYLASE in the last 168 hours. No results for input(s): AMMONIA in the last 168 hours.  Coagulation Profile: No results for input(s): INR, PROTIME in the last 168 hours.  Cardiac Enzymes:  Recent Labs Lab 07/21/16 2018 07/22/16 0301 07/22/16 0443  07/22/16 0735  TROPONINI 0.05* 0.05* 0.04* 0.04*    BNP (last 3 results) No results for input(s): PROBNP in the last 8760 hours.  HbA1C:  Recent Labs  07/22/16 0301  HGBA1C 8.1*    CBG:  Recent Labs Lab 07/22/16 1217 07/22/16 1621 07/22/16 2133 07/23/16 0735 07/23/16 1039  GLUCAP 140* 184* 200* 175* 213*    Lipid Profile:  Recent Labs  07/22/16 0301  CHOL 279*  HDL 31*  LDLCALC UNABLE TO CALCULATE IF TRIGLYCERIDE OVER 400 mg/dL  TRIG 161*  CHOLHDL 9.0    Thyroid Function Tests:  Recent Labs  07/22/16 0301  TSH 3.782    Anemia Panel:  Recent Labs  07/22/16 0443  TIBC 248*  IRON 44*    Urine analysis:    Component Value Date/Time   COLORURINE YELLOW 07/21/2016 2018   APPEARANCEUR CLEAR 07/21/2016 2018   LABSPEC 1.014 07/21/2016 2018   PHURINE 6.5 07/21/2016 2018   GLUCOSEU 100 (A) 07/21/2016 2018   HGBUR TRACE (A) 07/21/2016 2018   BILIRUBINUR NEGATIVE 07/21/2016 2018   KETONESUR NEGATIVE 07/21/2016 2018   PROTEINUR >300 (A) 07/21/2016 2018   NITRITE NEGATIVE 07/21/2016 2018   LEUKOCYTESUR NEGATIVE 07/21/2016 2018    Sepsis Labs: Lactic Acid, Venous No results found for: LATICACIDVEN  MICROBIOLOGY: Recent Results (from the past 240 hour(s))  MRSA PCR Screening     Status: None   Collection Time: 07/22/16 12:20 AM  Result Value Ref Range Status   MRSA by PCR NEGATIVE NEGATIVE Final    Comment:        The GeneXpert MRSA Assay (FDA approved for NASAL specimens only), is one component of a comprehensive MRSA colonization surveillance program. It is not intended to diagnose MRSA infection nor to guide or monitor treatment for MRSA infections.     RADIOLOGY STUDIES/RESULTS: Dg Chest 2 View  Result Date: 07/21/2016 CLINICAL DATA:  56 y/o  M; palpitations with headache. EXAM: CHEST  2 VIEW COMPARISON:  09/24/2014 chest radiograph. FINDINGS: Stable heart size and mediastinal contours are within normal limits. Both lungs are clear.  The visualized skeletal structures are unremarkable. IMPRESSION: No active cardiopulmonary disease. Electronically Signed   By: Mitzi Hansen M.D.   On: 07/21/2016 23:39   Nm Myocar Multi W/spect W/wall Motion / Ef  Result Date: 07/23/2016 CLINICAL DATA:  Chest pain over the past 3 days. Diabetes and hypertension. Prior stroke. EXAM: MYOCARDIAL IMAGING WITH SPECT (REST AND PHARMACOLOGIC-STRESS) GATED LEFT VENTRICULAR WALL MOTION STUDY LEFT VENTRICULAR EJECTION FRACTION TECHNIQUE: Standard myocardial SPECT imaging was performed after resting intravenous injection of 10 mCi Tc-69m tetrofosmin. Subsequently, intravenous infusion of Lexiscan was performed under the supervision of the Cardiology staff. At peak effect of the drug, 30 mCi Tc-47m tetrofosmin was injected intravenously and standard myocardial SPECT imaging was performed. Quantitative gated imaging was also performed to evaluate left ventricular wall  motion, and estimate left ventricular ejection fraction. COMPARISON:  None. FINDINGS: Perfusion: Large inferior wall and apical scar. There is some reverse redistribution in the lateral wall. No findings of inducible ischemia. Wall Motion: Lateral wall hypokinesis. Poor wall thickening of the cardiac apex. Left Ventricular Ejection Fraction: 45 % End diastolic volume 150 ml End systolic volume 82 ml IMPRESSION: 1. Large scar of the inferior wall and cardiac apex. 2. Lateral hypokinesis with poor wall thickening of the cardiac apex. 3. Left ventricular ejection fraction 45% 4. Non invasive risk stratification*: High (due to the large scar and moderate elevated end-systolic left ventricular volume). *2012 Appropriate Use Criteria for Coronary Revascularization Focused Update: J Am Coll Cardiol. 2012;59(9):857-881. http://content.dementiazones.comonlinejacc.org/article.aspx?articleid=1201161 Electronically Signed   By: Gaylyn RongWalter  Liebkemann M.D.   On: 07/23/2016 15:04     LOS: 0 days   Branko Steeves U Duvan Mousel,DO  [facility] Pager:[phone]  If 7PM-7AM, please contact night-coverage [email or web address] Password [email or web address] 07/23/2016, 3:10 PM

## 2016-07-24 NOTE — Care Management Note (Signed)
Case Management Note  Patient Details  Name: Philip Martin MRN: [id number] Date of Birth: 06/10/1960  Subjective/Objective: Late Entry: Pt presented for chest pain. Pt is from home with wife. Pt with a hx of stroke in the past. Pt was listed as having Medicaid, however Medicaid is not active so pt is a self pay. CM did call the Financial Counselor to touch base with patient. Pt is without PCP.                    Action/Plan: Pt wanted PCP in [place] due to residence. CM did call [facility] to see if any appointments available. Per Office pt will have to come in and make an appointment and fill out information for medication assistance in order to get medications from the [facility]. Pt has address and contact number. No further needs from CM at this time.   Expected Discharge Date:  07/23/16               Expected Discharge Plan:  Home/Self Care  In-House Referral:  NA  Discharge planning Services  CM Consult  Post Acute Care Choice:  NA Choice offered to:  NA  DME Arranged:  N/A DME Agency:  NA  HH Arranged:  NA HH Agency:  NA  Status of Service:  Completed, signed off  If discussed at Long Length of Stay Meetings, dates discussed:    Additional Comments:  Gala LewandowskyGraves-Bigelow, Faithe Ariola Kaye, RN 07/24/2016, 10:41 AM

## 2016-07-31 ENCOUNTER — Encounter: Payer: Self-pay | Admitting: Internal Medicine

## 2016-07-31 ENCOUNTER — Ambulatory Visit (INDEPENDENT_AMBULATORY_CARE_PROVIDER_SITE_OTHER): Payer: Self-pay | Admitting: Internal Medicine

## 2016-07-31 VITALS — BP 146/84 | HR 78 | Ht 71.0 in | Wt 190.6 lb

## 2016-07-31 DIAGNOSIS — N183 Chronic kidney disease, stage 3 unspecified: Secondary | ICD-10-CM

## 2016-07-31 DIAGNOSIS — E782 Mixed hyperlipidemia: Secondary | ICD-10-CM

## 2016-07-31 DIAGNOSIS — I160 Hypertensive urgency: Secondary | ICD-10-CM

## 2016-07-31 DIAGNOSIS — I129 Hypertensive chronic kidney disease with stage 1 through stage 4 chronic kidney disease, or unspecified chronic kidney disease: Secondary | ICD-10-CM

## 2016-07-31 DIAGNOSIS — E1165 Type 2 diabetes mellitus with hyperglycemia: Secondary | ICD-10-CM

## 2016-07-31 DIAGNOSIS — E1322 Other specified diabetes mellitus with diabetic chronic kidney disease: Secondary | ICD-10-CM

## 2016-07-31 DIAGNOSIS — E118 Type 2 diabetes mellitus with unspecified complications: Secondary | ICD-10-CM

## 2016-07-31 DIAGNOSIS — IMO0002 Reserved for concepts with insufficient information to code with codable children: Secondary | ICD-10-CM

## 2016-07-31 DIAGNOSIS — Z794 Long term (current) use of insulin: Secondary | ICD-10-CM

## 2016-07-31 MED ORDER — METFORMIN HCL 500 MG PO TABS: 500.0000 mg | ORAL_TABLET | Freq: Two times a day (BID) | ORAL | 3 refills | Status: AC

## 2016-07-31 MED ORDER — CARVEDILOL 25 MG PO TABS: 25.0000 mg | ORAL_TABLET | Freq: Two times a day (BID) | ORAL | 5 refills | Status: AC

## 2016-07-31 NOTE — Progress Notes (Signed)
OFFICE FOLLOW-UP NOTE  Chief Complaint:  Hospital follow-up, feeling better  Primary Care Physician: System, Pcp Not In  HPI:  Philip Martin is a 56 y.o. male with a past medial history significant for stroke in the past and chronic kidney disease. Recently he was hospitalized for hypertensive urgency/emergency with headaches and significant systolic and diastolic hypertension. Blood pressure was controlled after starting medications and is improved significantly. His headaches have improved. He denied any chest pain or worsening shortness of breath. He did have elevated troponins which were flat. His stress test in the hospital demonstrated a large inferior wall and apical scar. There was some reverse redistribution on the lateral wall but no reversible ischemia. The lateral wall was thought to be hypokinetic with an EF of 45%. This is read by Dr. Janeece Fitting (radiology). I have personally reviewed the images and suspect that this is actually artifact. He also had an echocardiogram which showed a normal EF of 60-65% and no wall motion abnormalities. There was no evidence of scar in the echo. Blood pressure is improved today 146/84 but not yet at goal. He is tolerating medications without any difficulty. He had new diagnosis of diabetes in the hospital was provided a glucometer with test strips. He is not yet done any fingerstick blood checks. He also was prescribed Tradjenta, but has not been started on that medication because of cost. His wife told me the cost would be more than $500 a month. I advised switching him to metformin and discussed the possible side effects of medications today.  PMHx:  Past Medical History:  Diagnosis Date  . Diabetes mellitus without complication ([place])   . Hypertension   . Renal disorder   . Stroke [facility])     No past surgical history on file.  FAMHx:  No family history on file.  SOCHx:   reports that he quit smoking 10 days ago. He has never used smokeless  tobacco. He reports that he does not drink alcohol or use drugs.  ALLERGIES:  No Known Allergies  ROS: Pertinent items noted in HPI and remainder of comprehensive ROS otherwise negative.  HOME MEDS: Current Outpatient Prescriptions on File Prior to Visit  Medication Sig Dispense Refill  . amLODipine (NORVASC) 10 MG tablet Take 1 tablet (10 mg total) by mouth daily. 30 tablet 0  . carvedilol (COREG) 12.5 MG tablet Take 1 tablet (12.5 mg total) by mouth 2 (two) times daily with a meal. 60 tablet 0  . aspirin 325 MG EC tablet Take 325 mg by mouth daily.    Marland Kitchen atorvastatin (LIPITOR) 40 MG tablet Take 1 tablet (40 mg total) by mouth daily at 6 PM. (Patient not taking: Reported on 07/31/2016) 30 tablet 0  . blood glucose meter kit and supplies KIT Dispense based on patient and insurance preference. Use up to four times daily as directed. (FOR ICD-9 250.00, 250.01). (Patient not taking: Reported on 07/31/2016) 1 each 0  . fenofibrate 54 MG tablet Take 1 tablet (54 mg total) by mouth daily with lunch. (Patient not taking: Reported on 07/31/2016) 30 tablet 0  . linagliptin (TRADJENTA) 5 MG TABS tablet Take 1 tablet (5 mg total) by mouth daily. (Patient not taking: Reported on 07/31/2016) 30 tablet 0   No current facility-administered medications on file prior to visit.     LABS/IMAGING: No results found for this or any previous visit (from the past 48 hour(s)). No results found.  LIPID PANEL:    Component Value Date/Time  CHOL 279 (H) 07/22/2016 0301   TRIG 537 (H) 07/22/2016 0301   HDL 31 (L) 07/22/2016 0301   CHOLHDL 9.0 07/22/2016 0301   VLDL UNABLE TO CALCULATE IF TRIGLYCERIDE OVER 400 mg/dL 07/22/2016 0301   LDLCALC UNABLE TO CALCULATE IF TRIGLYCERIDE OVER 400 mg/dL 07/22/2016 0301     WEIGHTS: Wt Readings from Last 3 Encounters:  07/31/16 190 lb 9.6 oz (86.5 kg)  07/23/16 187 lb 1.6 oz (84.9 kg)  09/24/14 200 lb (90.7 kg)    VITALS: BP (!) 146/84   Pulse 78   Ht _0  (1.803  m)   Wt 190 lb 9.6 oz (86.5 kg)   BMI 26.58 kg/m   EXAM: General appearance: alert, no distress and Mildly slurred speech Neck: no carotid bruit and no JVD Lungs: clear to auscultation bilaterally Heart: regular rate and rhythm Abdomen: soft, non-tender; bowel sounds normal; no masses,  no organomegaly Extremities: extremities normal, atraumatic, no cyanosis or edema Pulses: 2+ and symmetric Skin: Skin color, texture, turgor normal. No rashes or lesions Neurologic: Mental status: Alert, oriented, thought content appropriate, Right arm and leg weakness Psych: Pleasant  EKG: Deferred  ASSESSMENT: 1. Recent hypertensive emergency 2. Abnormal stress Myoview suggesting a large inferoapical defect which was worse at rest than stress, suspect artifact 3. Normal LVEF 60-65% with normal wall motion on echo 4. History of stroke 5. New onset type 2 diabetes - A1c 8.1 6. Mixed dyslipidemia with primarily hypertriglyceridemia 7. CKD 3b  PLAN: 1.   [name] had recent hypertensive emergency - blood pressure is now better controlled but not at goal. I advised increasing his carvedilol 25 mg twice daily. He should remain on amlodipine 10 mg daily. He is not on a ARB secondary to his CK D3 B. I would continue on full dose aspirin as provided by neurology for history of stroke. He was started on atorvastatin and fenofibrate for dyslipidemia. Will check a repeat lipid profile as well as a repeat A1c in 3 months. Due to the high cost of linagliptin, will switch to metformin and encourage compliance with that and regular fingerstick checks.  Follow-up with me in one month for repeat blood pressure check.  Pixie Casino, MD, [place]  Attending Cardiologist  Direct Dial: [phone]  Fax: [phone]  Website:  [email or web address] 07/31/2016, 5:31 PM

## 2016-07-31 NOTE — Patient Instructions (Addendum)
Medication Instructions:  INCREASE YOUR CARVEDILOL TO 25 MG TWICE A DAY   STOP TRADJENTA  START METFORMIN 500 MG TWICE A DAY   Labwork: FASTING LP/A1C IN 3 MONTHS AT OUR OFFICE  Testing/Procedures: NONE  Follow-Up: Your physician recommends that you schedule a follow-up appointment in: 1 MONTH OV  Any Other Special Instructions Will Be Listed Below (If Applicable).  If you need a refill on your cardiac medications before your next appointment, please call your pharmacy.

## 2016-08-05 ENCOUNTER — Telehealth: Payer: Self-pay | Admitting: Internal Medicine

## 2016-08-05 NOTE — Telephone Encounter (Signed)
Left message for patient to call.  He needs follow up appt with dr Rennis Goldenhilty one month.  Approximately 6/15.

## 2018-04-29 IMAGING — DX DG CHEST 2V
2 series · 2 of 2 positions shown · non-contrast
Comparison: 09/24/2014 chest radiograph.

CLINICAL DATA: 55 y/o  M; palpitations with headache.

EXAM:
CHEST  2 VIEW

[chest pa]
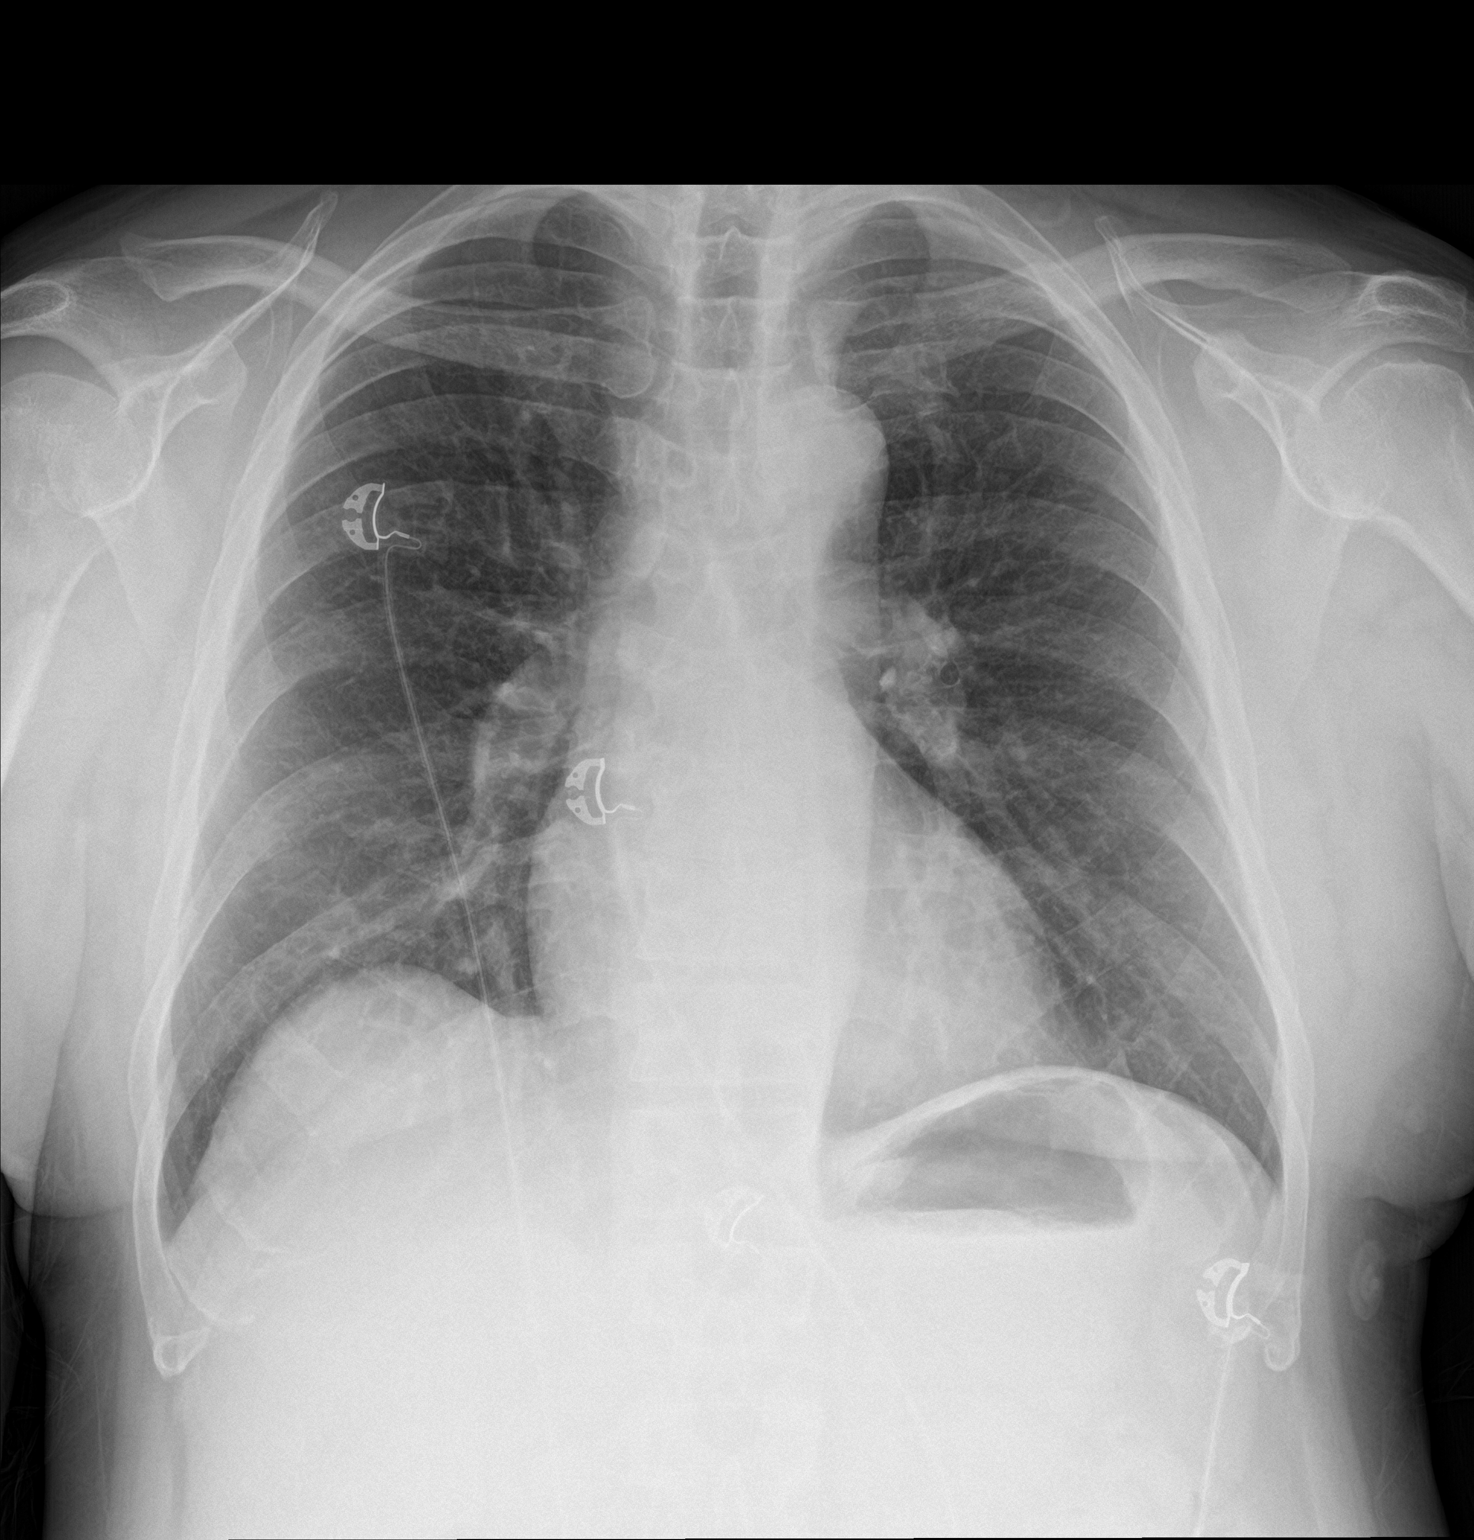

[chest lat]
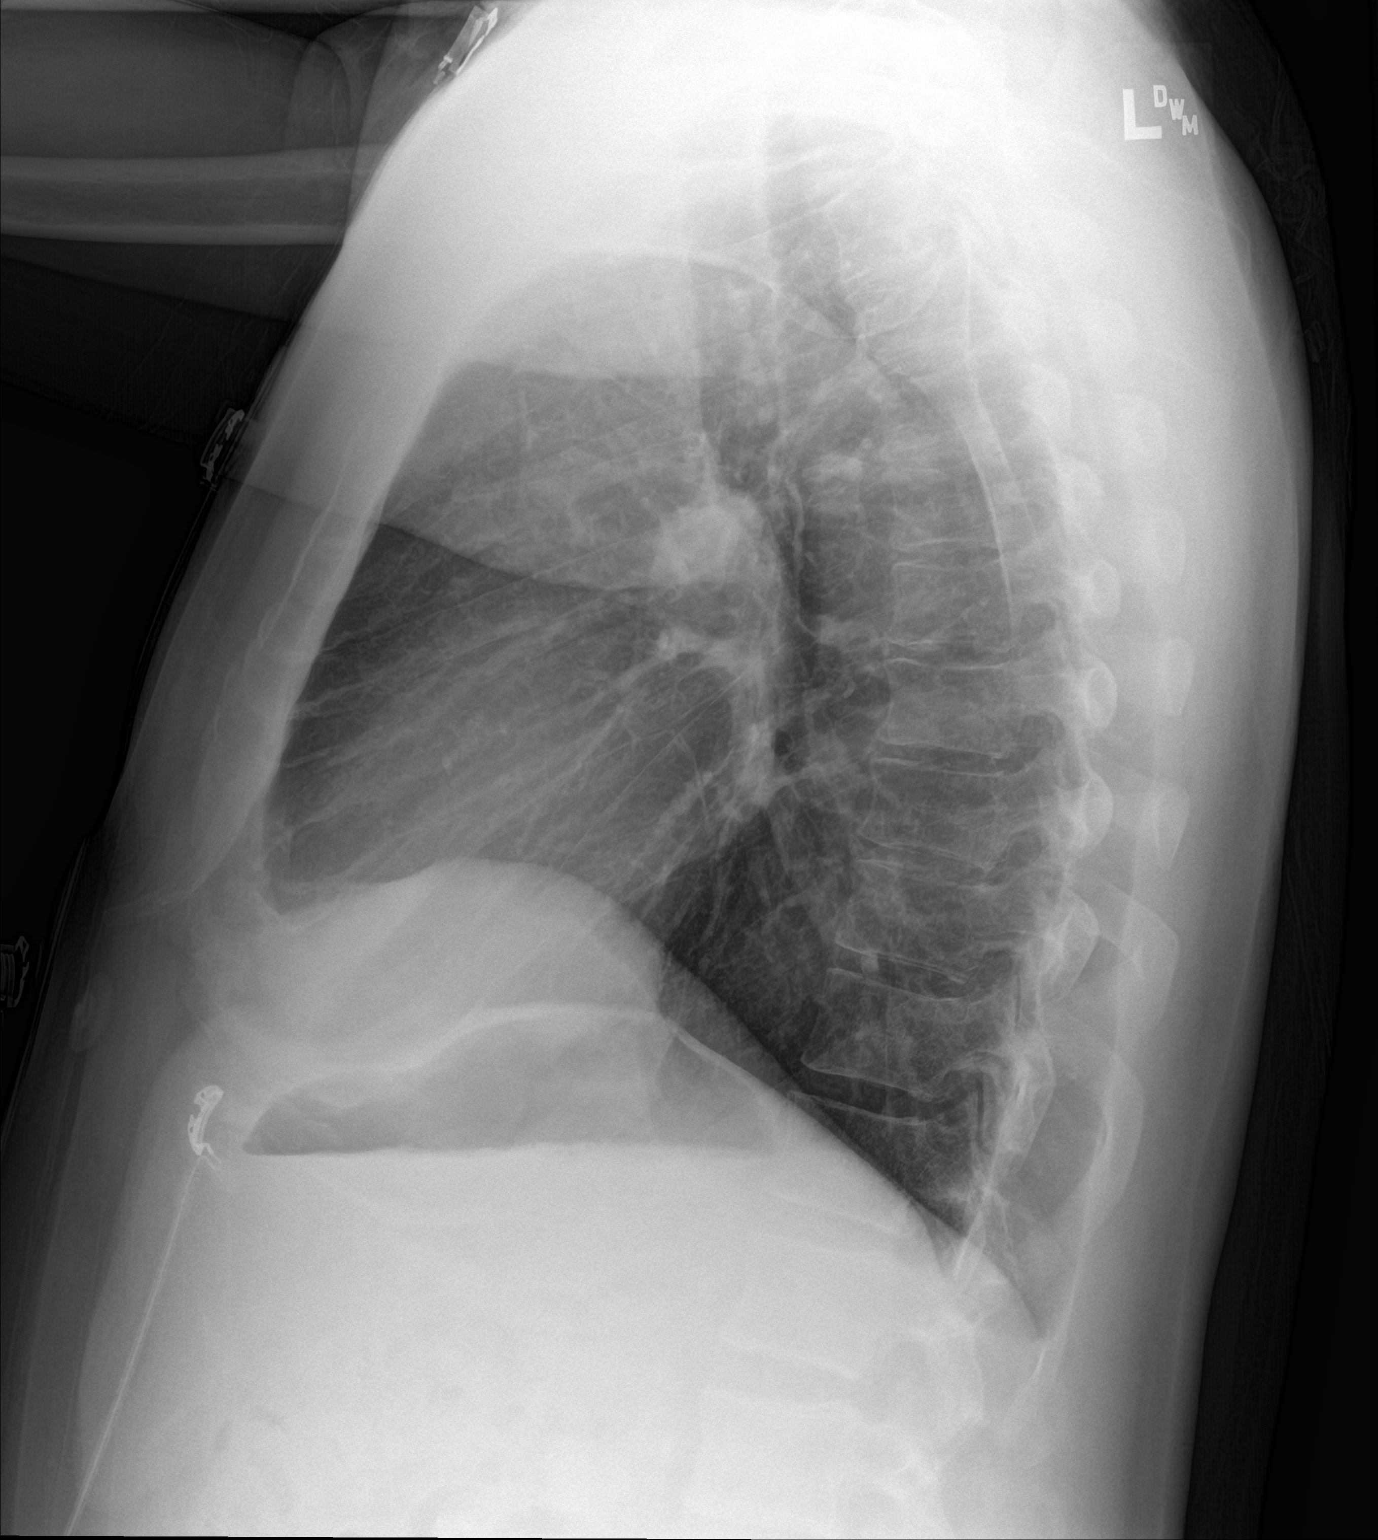

[2 of 2 positions shown; findings below may reference images not displayed]

FINDINGS: Stable heart size and mediastinal contours are within normal limits.
Both lungs are clear. The visualized skeletal structures are
unremarkable.
IMPRESSION: No active cardiopulmonary disease.

By: Anastacio Yvette M.D.
